# Patient Record
Sex: Female | Born: 1985
Health system: Southern US, Community
[De-identification: ages and names within clinical notes are randomized; demographics above are authoritative.]

## PROBLEM LIST (undated history)

## (undated) DIAGNOSIS — R519 Headache, unspecified: Secondary | ICD-10-CM

## (undated) DIAGNOSIS — R112 Nausea with vomiting, unspecified: Secondary | ICD-10-CM

## (undated) DIAGNOSIS — R51 Headache: Secondary | ICD-10-CM

## (undated) DIAGNOSIS — Z9889 Other specified postprocedural states: Secondary | ICD-10-CM

## (undated) HISTORY — PX: BREAST SURGERY: SHX581

## (undated) HISTORY — DX: Headache, unspecified: R51.9

## (undated) HISTORY — PX: LYMPH NODE DISSECTION: SHX5087

## (undated) HISTORY — PX: BREAST REDUCTION SURGERY: SHX8

## (undated) HISTORY — DX: Headache: R51

---

## 2008-03-28 ENCOUNTER — Emergency Department (HOSPITAL_COMMUNITY): Admission: EM | Admit: 2008-03-28 | Discharge: 2008-03-28 | Payer: Self-pay | Admitting: Emergency Medicine

## 2008-09-09 ENCOUNTER — Emergency Department (HOSPITAL_COMMUNITY): Admission: EM | Admit: 2008-09-09 | Discharge: 2008-09-09 | Payer: Self-pay | Admitting: Family Medicine

## 2008-09-25 ENCOUNTER — Encounter: Admission: RE | Admit: 2008-09-25 | Discharge: 2008-11-06 | Payer: Self-pay | Admitting: Family Medicine

## 2010-05-21 ENCOUNTER — Ambulatory Visit (HOSPITAL_BASED_OUTPATIENT_CLINIC_OR_DEPARTMENT_OTHER): Admission: RE | Admit: 2010-05-21 | Discharge: 2010-05-21 | Payer: Self-pay | Admitting: General Surgery

## 2011-01-22 LAB — POCT HEMOGLOBIN-HEMACUE: Hemoglobin: 11.4 g/dL — ABNORMAL LOW (ref 12.0–15.0)

## 2011-03-24 ENCOUNTER — Other Ambulatory Visit: Payer: Self-pay | Admitting: Plastic Surgery

## 2013-04-13 ENCOUNTER — Inpatient Hospital Stay (HOSPITAL_COMMUNITY)
Admission: AD | Admit: 2013-04-13 | Discharge: 2013-04-13 | Disposition: A | Discharge status: Home / Self Care | Payer: BC Managed Care – PPO | Source: Ambulatory Visit | Attending: Obstetrics & Gynecology | Admitting: Obstetrics & Gynecology

## 2013-04-13 ENCOUNTER — Encounter (HOSPITAL_COMMUNITY): Payer: Self-pay

## 2013-04-13 DIAGNOSIS — B3731 Acute candidiasis of vulva and vagina: Secondary | ICD-10-CM | POA: Insufficient documentation

## 2013-04-13 DIAGNOSIS — L293 Anogenital pruritus, unspecified: Secondary | ICD-10-CM | POA: Insufficient documentation

## 2013-04-13 DIAGNOSIS — B373 Candidiasis of vulva and vagina: Secondary | ICD-10-CM

## 2013-04-13 HISTORY — DX: Other specified postprocedural states: Z98.890

## 2013-04-13 HISTORY — DX: Nausea with vomiting, unspecified: R11.2

## 2013-04-13 LAB — WET PREP, GENITAL: Clue Cells Wet Prep HPF POC: NONE SEEN

## 2013-04-13 MED ORDER — FLUCONAZOLE 150 MG PO TABS
150.0000 mg | ORAL_TABLET | Freq: Once | ORAL | Status: AC
  Administered 2013-04-13: 150 mg via ORAL
  Filled 2013-04-13: qty 1

## 2013-04-13 MED ORDER — TERCONAZOLE 0.4 % VA CREA: 1.0000 | TOPICAL_CREAM | Freq: Every day | VAGINAL | Status: DC

## 2013-04-13 NOTE — MAU Provider Note (Signed)
  History     CSN: [id number]  Arrival date and time: 04/13/13 5188   First Provider Initiated Contact with Patient 04/13/13 1900      Chief Complaint  Patient presents with  . Vaginal Itching   HPI Joanne Summers is 27 y.o. G1P0010 patient of Deboraha Sprang presents with vaginal irritation after taking Amoxicillin X 5 from her dentist.  Describes as stinging.  Denies abnormal vaginal discharge, bleeding and abdominal pain.  Past Medical History  Diagnosis Date  . PONV (postoperative nausea and vomiting)     Past Surgical History  Procedure Laterality Date  . Lymph node dissection    . Breast reduction surgery      No family history on file.  History  Substance Use Topics  . Smoking status: Never Smoker   . Smokeless tobacco: Not on file  . Alcohol Use: No    Allergies: Allergies not on file  No prescriptions prior to admission    Review of Systems  Constitutional: Negative for fever and chills.  Gastrointestinal: Negative for abdominal pain.  Genitourinary: Negative for dysuria, urgency and frequency.       + for vaginal irritation   Physical Exam   Blood pressure 122/67, pulse 86, temperature 98.3 F (36.8 C), temperature source Oral, resp. rate 18, height 5\' 2"  (1.575 m), weight 140 lb 12.8 oz (63.866 kg), last menstrual period 03/30/2013.  Physical Exam  Constitutional: She is oriented to person, place, and time. She appears well-developed and well-nourished. No distress.  HENT:  Head: Normocephalic.  Neck: Normal range of motion.  Cardiovascular: Normal rate.   Respiratory: Effort normal.  GI: There is no tenderness.  Genitourinary: There is no rash, tenderness or lesion on the right labia. There is no rash, tenderness or lesion on the left labia. There is erythema and tenderness (mild) around the vagina. No bleeding around the vagina. No foreign body around the vagina. Vaginal discharge (small amount of white discharge ) found.  Internal exam not indicated   Neurological: She is alert and oriented to person, place, and time.  Skin: Skin is warm and dry.  Psychiatric: She has a normal mood and affect. Her behavior is normal.   Results for orders placed during the hospital encounter of 04/13/13 (from the past 24 hour(s))  WET PREP, GENITAL     Status: Abnormal   Collection Time    04/13/13  6:55 PM      Result Value Range   Yeast Wet Prep HPF POC FEW (*) NONE SEEN   Trich, Wet Prep NONE SEEN  NONE SEEN   Clue Cells Wet Prep HPF POC NONE SEEN  NONE SEEN   WBC, Wet Prep HPF POC MODERATE (*) NONE SEEN   MAU Course  Procedures  MDM Diflucan 150mg  po given in MAU  Assessment and Plan  A:  Vaginal irritation       Vaginal Yeast Infection  P: Rx for Terazol 7 Vaginal Creme to the pharmacy--to use at hs X 7hs      Follow up with her PCP if sxs continue after treatment.  KEY,EVE M 04/13/2013, 7:01 PM

## 2013-04-13 NOTE — MAU Note (Signed)
Pt reports having vaginal irritation after completing amoxicillin. No discharge. Feels dry and almost burning feeling.

## 2014-09-08 ENCOUNTER — Encounter (HOSPITAL_COMMUNITY): Payer: Self-pay

## 2018-04-05 ENCOUNTER — Ambulatory Visit: Payer: 59 | Admitting: Neurology

## 2018-04-05 ENCOUNTER — Encounter: Payer: Self-pay | Admitting: Neurology

## 2018-04-05 VITALS — BP 136/78 | HR 96 | Ht 62.0 in | Wt 168.0 lb

## 2018-04-05 DIAGNOSIS — H532 Diplopia: Secondary | ICD-10-CM | POA: Diagnosis not present

## 2018-04-05 DIAGNOSIS — R51 Headache with orthostatic component, not elsewhere classified: Secondary | ICD-10-CM

## 2018-04-05 DIAGNOSIS — H0520 Unspecified exophthalmos: Secondary | ICD-10-CM | POA: Diagnosis not present

## 2018-04-05 DIAGNOSIS — H5710 Ocular pain, unspecified eye: Secondary | ICD-10-CM

## 2018-04-05 DIAGNOSIS — H05119 Granuloma of unspecified orbit: Secondary | ICD-10-CM | POA: Diagnosis not present

## 2018-04-05 DIAGNOSIS — G8929 Other chronic pain: Secondary | ICD-10-CM

## 2018-04-05 DIAGNOSIS — H539 Unspecified visual disturbance: Secondary | ICD-10-CM

## 2018-04-05 DIAGNOSIS — G4484 Primary exertional headache: Secondary | ICD-10-CM

## 2018-04-05 DIAGNOSIS — G932 Benign intracranial hypertension: Secondary | ICD-10-CM

## 2018-04-05 DIAGNOSIS — H4710 Unspecified papilledema: Secondary | ICD-10-CM | POA: Insufficient documentation

## 2018-04-05 DIAGNOSIS — R519 Headache, unspecified: Secondary | ICD-10-CM

## 2018-04-05 NOTE — Progress Notes (Signed)
[facility]    Provider:  Dr Lucia Gaskins Referring Provider: Elwin Mocha* Primary Care Physician:  Ileana Ladd, MD  CC:  Optic nerve head edema  HPI:  Joanne Summers is a 32 y.o. female here as a referral from Dr. Sherryll Burger for bilateral disc edema.  Vision in our office today is 20/20 OD and OS.  She was seen by ophthalmology and sent here for evaluation for pseudotumor cerebri. Last summer she noticed a lymph node on her neck, she started getting headaches, random aches, she had pressure and tingling and aches in the head, she went to the eye doctor and veins were enlarged. Feels pressure in the head, she has double vision, blurry vision, dizziness and spots. Presure os on the right side of the head. Worse with exertion. No fullness in the ears. No IUD. No long term use of antibiotics, retin or acutane. No migrainous features. Headaches daily. Can last all day and be positional. No other focal neurologic deficits, associated symptoms, inciting events or modifiable factors.  Reviewed notes, labs and imaging from outside physicians, which showed:  Reviewed [facility].  32 year old female who presented for pseudotumor cerebra in the right eye and left eye.  She went in her yearly eye exam with Dr. Katrinka Blazing and told her that her eyes were swollen in both eyes.  She does have a headache aching at the top of the head.  No pulsatile tinnitus.  She is on birth control she is 5 to 160 pounds.  No recent head trauma.  Reviewed results of his exam which include pupils equally round and reactive to light no APD, open angles, visual fields full, extraocular movements full adnexa normal lids and lashes normal slit-lamp normal but optic nerves did show disc prominence disc edema bilaterally diagnosed with optic disc edema.  Visual field testing was outside normal limits but I do not have the actual data.  Review of Systems: Patient complains of symptoms per HPI as well as  the following symptoms: Headache, numbness, skin sensitivity. Pertinent negatives and positives per HPI. All others negative.   Social History   Socioeconomic History  . Marital status: Single    Spouse name: Not on file  . Number of children: Not on file  . Years of education: Not on file  . Highest education level: Not on file  Occupational History  . Not on file  Social Needs  . Financial resource strain: Not on file  . Food insecurity:    Worry: Not on file    Inability: Not on file  . Transportation needs:    Medical: Not on file    Non-medical: Not on file  Tobacco Use  . Smoking status: Never Smoker  . Smokeless tobacco: Never Used  Substance and Sexual Activity  . Alcohol use: No  . Drug use: No  . Sexual activity: Yes    Birth control/protection: Pill  Lifestyle  . Physical activity:    Days per week: Not on file    Minutes per session: Not on file  . Stress: Not on file  Relationships  . Social connections:    Talks on phone: Not on file    Gets together: Not on file    Attends religious service: Not on file    Active member of club or organization: Not on file    Attends meetings of clubs or organizations: Not on file    Relationship status: Not on file  . Intimate partner  violence:    Fear of current or ex partner: Not on file    Emotionally abused: Not on file    Physically abused: Not on file    Forced sexual activity: Not on file  Other Topics Concern  . Not on file  Social History Narrative  . Not on file    Family History  Problem Relation Age of Onset  . Migraines Neg Hx     Past Medical History:  Diagnosis Date  . Headache   . PONV (postoperative nausea and vomiting)     Past Surgical History:  Procedure Laterality Date  . BREAST REDUCTION SURGERY    . LYMPH NODE DISSECTION      Current Outpatient Medications  Medication Sig Dispense Refill  . Levonorgestrel-Ethinyl Estradiol (SEASONIQUE) 0.15-0.03 &0.01 MG tablet Take 1  tablet by mouth daily.    . Multiple Vitamin (MULTIVITAMIN) capsule Take by mouth.     No current facility-administered medications for this visit.     Allergies as of 04/05/2018  . (No Known Allergies)    Vitals: BP 136/78   Pulse 96   Ht  (1.575 m)   Wt 168 lb (76.2 kg)   BMI 30.73 kg/m  Last Weight:  Wt Readings from Last 1 Encounters:  04/05/18 168 lb (76.2 kg)   Last Height:   Ht Readings from Last 1 Encounters:  04/05/18  (1.575 m)   Physical exam: Exam: Gen: NAD, conversant, well nourised, obese, well groomed                     CV: RRR, no MRG. No Carotid Bruits. No peripheral edema, warm, nontender Eyes: Conjunctivae clear without exudates or hemorrhage  Neuro: Detailed Neurologic Exam  Speech:    Speech is normal; fluent and spontaneous with normal comprehension.  Cognition:    The patient is oriented to person, place, and time;     recent and remote memory intact;     language fluent;     normal attention, concentration,     fund of knowledge Cranial Nerves:    The pupils are equal, round, and reactive to light. Proptosis of the globes. The fundi are blurred Visual fields are full to finger confrontation. Extraocular movements are intact. Trigeminal sensation is intact and the muscles of mastication are normal. The face is symmetric. The palate elevates in the midline. Hearing intact. Voice is normal. Shoulder shrug is normal. The tongue has normal motion without fasciculations.   Coordination:    Normal finger to nose and heel to shin. Normal rapid alternating movements.   Gait:    Heel-toe and tandem gait are normal.   Motor Observation:    No asymmetry, no atrophy, and no involuntary movements noted. Tone:    Normal muscle tone.    Posture:    Posture is normal. normal erect    Strength:    Strength is V/V in the upper and lower limbs.      Sensation: intact to LT     Reflex Exam:  DTR's:    Deep tendon reflexes in the upper  and lower extremities are normal bilaterally.   Toes:    The toes are downgoing bilaterally.   Clonus:    Clonus is absent.       Assessment/Plan:  32 year old patient with positional daily intractable headaches, bilateral optic disc edema, episodes of vision changes and diplopia, dizziness and vertigo. Need MRI of the brain to evaluate for space  occupying mass, severe chiari, intracranial hypertension or any other etiology. Exam also shows eye globe proptosis, will check thyroid panel and antibodies but also need MRI of the orbits to evaluate for compressive thyroid disease, orbital pseudotumor or other abnormalities causing diplopia, eye proptosis and other concerning symptoms above. Will also order LP. If elevated opening pressure, will start diamox. If vision or an other symptoms changes acutely or anything concerning go to ED immediately. -If above workup shows idiopathic intracranial HTN, will need MRV of the brain to distinguish benign intracranial hypertension (pseudotumor cerebri) from dural sinus thrombosis.    Orders Placed This Encounter  Procedures  . MR BRAIN W WO CONTRAST  . MR ORBITS W WO CONTRAST  . DG FLUORO GUIDED LOC OF NEEDLE/CATH TIP FOR SPINAL INJECT LT  . Thyroid Panel With TSH  . Thyroglobulin antibody  . Thyroid peroxidase antibody  . Comprehensive metabolic panel  . CBC   Cc: Dr. Sherryll Burger, Dr. Timmie Foerster, MD  [facility] [address] [place], [place] [address]  Phone [phone] Fax [phone]

## 2018-04-05 NOTE — Patient Instructions (Addendum)
MRI of the brain and orbits (will call) Labs today Lumbar Puncture (will call) 6 week follow up     Idiopathic Intracranial Hypertension Idiopathic intracranial hypertension (IIH) is a condition that increases pressure around the brain. The fluid that surrounds the brain and spinal cord (cerebrospinal fluid, CSF) increases and causes the pressure. Idiopathic means that the cause of this condition is not known. IIH affects the brain and spinal cord (is a neurological disorder). If this condition is not treated, it can cause vision loss or blindness. What increases the risk? You are more likely to develop this condition if:  You are severely overweight (obese).  You are a woman who has not gone through menopause.  You take certain medicines, such as birth control or steroids.  What are the signs or symptoms? Symptoms of IIH include:  Headaches. This is the most common symptom.  Pain in the shoulders or neck.  Nausea and vomiting.  A "rushing water" or pulsing sound within the ears (pulsatile tinnitus).  Double vision.  Blurred vision.  Brief episodes of complete vision loss.  How is this diagnosed? This condition may be diagnosed based on:  Your symptoms.  Your medical history.  CT scan of the brain.  MRI of the brain.  Magnetic resonance venogram (MRV) to check veins in the brain.  Diagnostic lumbar puncture. This is a procedure to remove and examine a sample of cerebrospinal fluid. This procedure can determine whether too much fluid may be causing IIH.  A thorough eye exam to check for swelling or nerve damage in the eyes.  How is this treated? Treatment for this condition depends on your symptoms. The goal of treatment is to decrease the pressure around your brain. Common treatments include:  Medicines to decrease the production of spinal fluid and lower the pressure within your skull.  Medicines to prevent or treat headaches.  Surgery to place drains  (shunts) in your brain to remove excess fluid.  Lumbar puncture to remove excess cerebrospinal fluid.  Follow these instructions at home:  If you are overweight or obese, work with your health care provider to lose weight.  Take over-the-counter and prescription medicines only as told by your health care provider.  Do not drive or use heavy machinery while taking medicines that can make you sleepy.  Keep all follow-up visits as told by your health care provider. This is important. Contact a health care provider if:  You have changes in your vision, such as: ? Double vision. ? Not being able to see colors (color vision). Get help right away if:  You have any of the following symptoms and they get worse or do not get better. ? Headaches. ? Nausea. ? Vomiting. ? Vision changes or difficulty seeing. Summary  Idiopathic intracranial hypertension (IIH) is a condition that increases pressure around the brain. The cause is not known (is idiopathic).  The most common symptom of IIH is headaches.  Treatment may include medicines or surgery to relieve the pressure on your brain. This information is not intended to replace advice given to you by your health care provider. Make sure you discuss any questions you have with your health care provider. Document Released: 01/02/2002 Document Revised: 09/14/2016 Document Reviewed: 09/14/2016 Elsevier Interactive Patient Education  2017 Elsevier Inc.     Acetazolamide sustained-release capsules What is this medicine? ACETAZOLAMIDE (a set a ZOLE a mide) is used to treat glaucoma. It is also used to treat and to prevent altitude or mountain sickness. This  medicine may be used for other purposes; ask your health care provider or pharmacist if you have questions. COMMON BRAND NAME(S): Diamox, Diamox Sequels What should I tell my health care provider before I take this medicine? They need to know if you have any of these  conditions: -diabetes -kidney disease -liver disease -lung disease -an unusual or allergic reaction to acetazolamide, sulfa drugs, other medicines, foods, dyes, or preservatives -pregnant or trying to get pregnant -breast-feeding How should I use this medicine? Take this medicine by mouth with a glass of water. Follow the directions on the prescription label. Take with food or on an empty stomach. Do not crush or chew. Take your doses at regular intervals. Do not take your medicine more often than directed. Do not stop taking except on your doctor's advice. Talk to your pediatrician regarding the use of this medicine in children. Special care may be needed. Patients over 8 years old may have a stronger reaction and need a smaller dose. Overdosage: If you think you have taken too much of this medicine contact a poison control center or emergency room at once. NOTE: This medicine is only for you. Do not share this medicine with others. What if I miss a dose? If you miss a dose, take it as soon as you can. If it is almost time for your next dose, take only that dose. Do not take double or extra doses. What may interact with this medicine? Do not take this medicine with any of the following medications: -methazolamide This medicine may also interact with the following medications: -aspirin and aspirin-like medicines -cyclosporine -lithium -medicine for diabetes -methenamine -other diuretics -phenytoin -primidone -quinidine -sodium bicarbonate -stimulant medicines like dextroamphetamine This list may not describe all possible interactions. Give your health care provider a list of all the medicines, herbs, non-prescription drugs, or dietary supplements you use. Also tell them if you smoke, drink alcohol, or use illegal drugs. Some items may interact with your medicine. What should I watch for while using this medicine? Visit your doctor or health care professional for regular checks on  your progress. You will need blood work done regularly. If you are diabetic, check your blood sugar as directed. You may need to be on a special diet while taking this medicine. Ask your doctor. Also, ask how many glasses of fluid you need to drink a day. You must not get dehydrated. You may get drowsy or dizzy. Do not drive, use machinery, or do anything that needs mental alertness until you know how this medicine affects you. Do not stand or sit up quickly, especially if you are an older patient. This reduces the risk of dizzy or fainting spells. This medicine can make you more sensitive to the sun. Keep out of the sun. If you cannot avoid being in the sun, wear protective clothing and use sunscreen. Do not use sun lamps or tanning beds/booths. What side effects may I notice from receiving this medicine? Side effects that you should report to your doctor or health care professional as soon as possible: -allergic reactions like skin rash, itching or hives, swelling of the face, lips, or tongue -breathing problems -confusion, depression -dark urine -fever -numbness, tingling in hands or feet -redness, blistering, peeling or loosening of the skin, including inside the mouth -ringing in the ears -seizure -unusually weak or tired -yellowing of the eyes or skin Side effects that usually do not require medical attention (report to your doctor or health care professional if they continue  or are bothersome): -change in taste -diarrhea -headache -loss of appetite -nausea, vomiting -passing urine more often This list may not describe all possible side effects. Call your doctor for medical advice about side effects. You may report side effects to FDA at 1-800-FDA-1088. Where should I keep my medicine? Keep out of the reach of children. Store at room temperature between 20 and 25 degrees C (68 and 77 degrees F). Throw away any unused medicine after the expiration date. NOTE: This sheet is a summary.  It may not cover all possible information. If you have questions about this medicine, talk to your doctor, pharmacist, or health care provider.  2018 Elsevier/Gold Standard (2008-01-16 11:00:54)

## 2018-04-06 LAB — CBC
HEMATOCRIT: 38.5 % (ref 34.0–46.6)
Hemoglobin: 12.6 g/dL (ref 11.1–15.9)
MCH: 31.3 pg (ref 26.6–33.0)
MCHC: 32.7 g/dL (ref 31.5–35.7)
MCV: 96 fL (ref 79–97)
Platelets: 413 10*3/uL (ref 150–450)
RBC: 4.03 x10E6/uL (ref 3.77–5.28)
RDW: 13 % (ref 12.3–15.4)
WBC: 7.4 10*3/uL (ref 3.4–10.8)

## 2018-04-06 LAB — COMPREHENSIVE METABOLIC PANEL
ALBUMIN: 4 g/dL (ref 3.5–5.5)
ALK PHOS: 31 IU/L — AB (ref 39–117)
ALT: 23 IU/L (ref 0–32)
AST: 24 IU/L (ref 0–40)
Albumin/Globulin Ratio: 1.3 (ref 1.2–2.2)
BUN/Creatinine Ratio: 11 (ref 9–23)
BUN: 10 mg/dL (ref 6–20)
Bilirubin Total: 0.3 mg/dL (ref 0.0–1.2)
CALCIUM: 9 mg/dL (ref 8.7–10.2)
CO2: 19 mmol/L — AB (ref 20–29)
CREATININE: 0.87 mg/dL (ref 0.57–1.00)
Chloride: 107 mmol/L — ABNORMAL HIGH (ref 96–106)
GFR calc Af Amer: 101 mL/min/{1.73_m2} (ref >59)
GFR, EST NON AFRICAN AMERICAN: 88 mL/min/{1.73_m2} (ref >59)
Globulin, Total: 3.1 g/dL (ref 1.5–4.5)
Glucose: 95 mg/dL (ref 65–99)
Potassium: 4.3 mmol/L (ref 3.5–5.2)
Sodium: 140 mmol/L (ref 134–144)
Total Protein: 7.1 g/dL (ref 6.0–8.5)

## 2018-04-06 LAB — THYROID PANEL WITH TSH
FREE THYROXINE INDEX: 1.4 (ref 1.2–4.9)
T3 Uptake Ratio: 19 % — ABNORMAL LOW (ref 24–39)
T4, Total: 7.4 ug/dL (ref 4.5–12.0)
TSH: 1.24 u[IU]/mL (ref 0.450–4.500)

## 2018-04-06 LAB — THYROGLOBULIN ANTIBODY

## 2018-04-06 LAB — THYROID PEROXIDASE ANTIBODY: Thyroperoxidase Ab SerPl-aCnc: 13 IU/mL (ref 0–34)

## 2018-04-09 ENCOUNTER — Telehealth: Payer: Self-pay | Admitting: Neurology

## 2018-04-09 ENCOUNTER — Telehealth: Payer: Self-pay | Admitting: *Deleted

## 2018-04-09 NOTE — Telephone Encounter (Signed)
[employer] Auth: [id number] (exp. 04/09/18 to 05/24/18) & [id number] (exp. 04/09/18 to 05/24/18) pt is scheduled at [facility] for 04/14/18.

## 2018-04-09 NOTE — Telephone Encounter (Signed)
Called pt and LVM (ok per DPR) informing her that her labs are unremarkable and her thyroid is normal. Left office number in case pt has any questions and informed her that a call back is not required.

## 2018-04-09 NOTE — Telephone Encounter (Signed)
Pt returned call. She wanted to clarify plan for starting Diamox and thought it was supposed be after testing. RN clarified that in her office visit, Dr. Lucia GaskinsAhern said that we would get the LP and if the opening pressure was high, we would start Diamox. Pt verbalized understanding and appreciation. She has an MRI scheduled for 6/8. Her LP is still pending scheduling.

## 2018-04-09 NOTE — Telephone Encounter (Signed)
-----   Message from Anson FretAntonia B Ahern, MD sent at 04/08/2018  7:23 PM EDT ----- Labs are unremarkable, thyroid id normal

## 2018-04-14 ENCOUNTER — Ambulatory Visit
Admission: RE | Admit: 2018-04-14 | Discharge: 2018-04-14 | Disposition: A | Discharge status: Home / Self Care | Payer: 59 | Source: Ambulatory Visit | Attending: Neurology | Admitting: Neurology

## 2018-04-14 ENCOUNTER — Ambulatory Visit
Admission: RE | Admit: 2018-04-14 | Discharge: 2018-04-14 | Disposition: A | Discharge status: Home / Self Care | Payer: BC Managed Care – PPO | Source: Ambulatory Visit | Attending: Neurology | Admitting: Neurology

## 2018-04-14 DIAGNOSIS — H0520 Unspecified exophthalmos: Secondary | ICD-10-CM

## 2018-04-14 DIAGNOSIS — H532 Diplopia: Secondary | ICD-10-CM | POA: Diagnosis not present

## 2018-04-14 DIAGNOSIS — H4710 Unspecified papilledema: Secondary | ICD-10-CM

## 2018-04-14 DIAGNOSIS — G4484 Primary exertional headache: Secondary | ICD-10-CM

## 2018-04-14 DIAGNOSIS — R519 Headache, unspecified: Secondary | ICD-10-CM

## 2018-04-14 DIAGNOSIS — H539 Unspecified visual disturbance: Secondary | ICD-10-CM

## 2018-04-14 DIAGNOSIS — R51 Headache with orthostatic component, not elsewhere classified: Secondary | ICD-10-CM

## 2018-04-14 DIAGNOSIS — G8929 Other chronic pain: Secondary | ICD-10-CM

## 2018-04-14 DIAGNOSIS — H5710 Ocular pain, unspecified eye: Secondary | ICD-10-CM

## 2018-04-14 DIAGNOSIS — H05119 Granuloma of unspecified orbit: Secondary | ICD-10-CM

## 2018-04-14 MED ORDER — GADOBENATE DIMEGLUMINE 529 MG/ML IV SOLN
15.0000 mL | Freq: Once | INTRAVENOUS | Status: AC | PRN
  Administered 2018-04-14: 15 mL via INTRAVENOUS

## 2018-04-17 ENCOUNTER — Telehealth: Payer: Self-pay | Admitting: *Deleted

## 2018-04-17 NOTE — Telephone Encounter (Addendum)
Called pt & LVM asking for call back. Left office number in message. Please pass message along to pt when she calls back.   ----- Message from Anson FretAntonia B Ahern, MD sent at 04/17/2018 11:05 AM EDT ----- MRI orbits normal  Notes recorded by Anson FretAhern, Antonia B, MD on 04/17/2018 at 11:02 AM EDT Unremarkable for age MRI thanks

## 2018-04-17 NOTE — Telephone Encounter (Signed)
FYI Pt called, message was relayed to her.  No questions. No request for a call back

## 2018-04-20 ENCOUNTER — Ambulatory Visit
Admission: RE | Admit: 2018-04-20 | Discharge: 2018-04-20 | Disposition: A | Discharge status: Home / Self Care | Payer: 59 | Source: Ambulatory Visit | Attending: Neurology | Admitting: Neurology

## 2018-04-20 DIAGNOSIS — H539 Unspecified visual disturbance: Secondary | ICD-10-CM

## 2018-04-20 DIAGNOSIS — H05119 Granuloma of unspecified orbit: Secondary | ICD-10-CM

## 2018-04-20 DIAGNOSIS — R51 Headache with orthostatic component, not elsewhere classified: Secondary | ICD-10-CM

## 2018-04-20 DIAGNOSIS — H532 Diplopia: Secondary | ICD-10-CM

## 2018-04-20 DIAGNOSIS — G932 Benign intracranial hypertension: Secondary | ICD-10-CM

## 2018-04-20 DIAGNOSIS — H5710 Ocular pain, unspecified eye: Secondary | ICD-10-CM

## 2018-04-20 DIAGNOSIS — G4484 Primary exertional headache: Secondary | ICD-10-CM

## 2018-04-20 DIAGNOSIS — G8929 Other chronic pain: Secondary | ICD-10-CM

## 2018-04-20 DIAGNOSIS — H4710 Unspecified papilledema: Secondary | ICD-10-CM

## 2018-04-20 DIAGNOSIS — H0520 Unspecified exophthalmos: Secondary | ICD-10-CM

## 2018-04-20 DIAGNOSIS — R519 Headache, unspecified: Secondary | ICD-10-CM

## 2018-04-20 LAB — CSF CELL COUNT WITH DIFFERENTIAL
RBC Count, CSF: 0 cells/uL (ref 0–10)
WBC, CSF: 4 cells/uL (ref 0–5)

## 2018-04-20 LAB — PROTEIN, CSF: Total Protein, CSF: 27 mg/dL (ref 15–45)

## 2018-04-20 LAB — GLUCOSE, CSF: Glucose, CSF: 49 mg/dL (ref 40–80)

## 2018-04-20 NOTE — Discharge Instructions (Signed)

## 2018-05-01 ENCOUNTER — Other Ambulatory Visit: Payer: Self-pay | Admitting: Neurology

## 2018-05-01 ENCOUNTER — Telehealth: Payer: Self-pay | Admitting: Neurology

## 2018-05-01 DIAGNOSIS — G932 Benign intracranial hypertension: Secondary | ICD-10-CM

## 2018-05-01 MED ORDER — ACETAZOLAMIDE 250 MG PO TABS: ORAL_TABLET | ORAL | 6 refills | Status: DC

## 2018-05-01 NOTE — Telephone Encounter (Signed)
Pt is asking for a call once the LP results are available

## 2018-05-01 NOTE — Telephone Encounter (Signed)
Spoke to patient, I'm sorry I didn't receive this result. Opening pressure was significantly elevated at 30. Will start Diamox 250mg  twice daily. Will hold off on MRV will discuss at appointment.

## 2018-05-02 NOTE — Telephone Encounter (Signed)
Noted  

## 2018-05-07 ENCOUNTER — Telehealth: Payer: Self-pay | Admitting: Neurology

## 2018-05-07 NOTE — Telephone Encounter (Signed)
Called the patient and made her aware that I reviewed her concerns with  Dr Lucia GaskinsAhern. I informed her that Dr Lucia GaskinsAhern states it can sometimes take about 3 wks before kicking in but typically its 4-6 wks before noticing improvement. I also informed her that Dr Lucia GaskinsAhern would like for her to stay well hydrated and make sure her urine is light yellow. If urine dark then encouraged her to increase fluids. Pt verbalized understanding and was appreciative for the call back. Pt had no questions at this time but was encouraged to call back if questions arise.

## 2018-05-07 NOTE — Telephone Encounter (Signed)
It may take 4-6 weeks at least to cee improvement or possibly up to 3 weeks. Continue medication, stay well hydrated make sure urine is a light yellow or increase fluids

## 2018-05-07 NOTE — Telephone Encounter (Signed)
Pt requesting a call stating she has a few questions.  1. Pt needing to know how long it takes for medication acetaZOLAMIDE (DIAMOX) 250 MG tablet to "kick in". Stating she has taken medication for about 12 days, and symptoms have gotten worse. 2. Also should pt be increasing her water intake while on medication. Pts pain has also moved from the front of her head to the back, pt is unclear why. Please call to advise

## 2018-06-12 ENCOUNTER — Telehealth: Payer: Self-pay | Admitting: Neurology

## 2018-06-12 ENCOUNTER — Encounter: Payer: Self-pay | Admitting: Neurology

## 2018-06-12 ENCOUNTER — Ambulatory Visit: Payer: 59 | Admitting: Neurology

## 2018-06-12 VITALS — BP 121/81 | HR 90 | Ht 62.0 in | Wt 160.0 lb

## 2018-06-12 DIAGNOSIS — G08 Intracranial and intraspinal phlebitis and thrombophlebitis: Secondary | ICD-10-CM | POA: Diagnosis not present

## 2018-06-12 DIAGNOSIS — G932 Benign intracranial hypertension: Secondary | ICD-10-CM

## 2018-06-12 NOTE — Progress Notes (Addendum)
[facility]    Provider:  Dr Lucia Gaskins Referring Provider: Ileana Ladd, MD Primary Care Physician:  Ileana Ladd, MD  CC:  Optic nerve head edema  Interval history December 10, 1983: Pressue has resolved, she doesn't experience the vision changes, tingling in the head is better, no double vision, no blurry vision, left ear fullness improved.  No headaches.  Will order CTV to complete workup.  Orders Placed This Encounter  Procedures  . CT ANGIO HEAD W OR WO CONTRAST    HPI:  Joanne Summers is a 32 y.o. female here as a referral from Dr. Modesto Charon for bilateral disc edema.  Vision in our office today is 20/20 OD and OS.  She was seen by ophthalmology and sent here for evaluation for pseudotumor cerebri. Last summer she noticed a lymph node on her neck, she started getting headaches, random aches, she had pressure and tingling and aches in the head, she went to the eye doctor and veins were enlarged. Feels pressure in the head, she has double vision, blurry vision, dizziness and spots. Presure os on the right side of the head. Worse with exertion. No fullness in the ears. No IUD. No long term use of antibiotics, retin or acutane. No migrainous features. Headaches daily. Can last all day and be positional. No other focal neurologic deficits, associated symptoms, inciting events or modifiable factors.  Reviewed notes, labs and imaging from outside physicians, which showed:  Reviewed [facility].  32 year old female who presented for pseudotumor cerebra in the right eye and left eye.  She went in her yearly eye exam with Dr. Katrinka Blazing and told her that her eyes were swollen in both eyes.  She does have a headache aching at the top of the head.  No pulsatile tinnitus.  She is on birth control she is 160 pounds.  No recent head trauma.  Reviewed results of his exam which include pupils equally round and reactive to light no APD, open angles, visual fields full, extraocular  movements full adnexa normal lids and lashes normal slit-lamp normal but optic nerves did show disc prominence disc edema bilaterally diagnosed with optic disc edema.  Visual field testing was outside normal limits but I do not have the actual data.   Review of Systems: Patient complains of symptoms per HPI as well as the following symptoms: Headache, numbness, skin sensitivity. Pertinent negatives and positives per HPI. All others negative.   Social History   Socioeconomic History  . Marital status: Single    Spouse name: Not on file  . Number of children: Not on file  . Years of education: Not on file  . Highest education level: Master's degree (e.g., MA, MS, MEng, MEd, MSW, MBA)  Occupational History  . Not on file  Social Needs  . Financial resource strain: Not on file  . Food insecurity:    Worry: Not on file    Inability: Not on file  . Transportation needs:    Medical: Not on file    Non-medical: Not on file  Tobacco Use  . Smoking status: Never Smoker  . Smokeless tobacco: Never Used  Substance and Sexual Activity  . Alcohol use: No  . Drug use: No  . Sexual activity: Yes    Birth control/protection: Pill  Lifestyle  . Physical activity:    Days per week: Not on file    Minutes per session: Not on file  . Stress: Not on file  Relationships  . Social  connections:    Talks on phone: Not on file    Gets together: Not on file    Attends religious service: Not on file    Active member of club or organization: Not on file    Attends meetings of clubs or organizations: Not on file    Relationship status: Not on file  . Intimate partner violence:    Fear of current or ex partner: Not on file    Emotionally abused: Not on file    Physically abused: Not on file    Forced sexual activity: Not on file  Other Topics Concern  . Not on file  Social History Narrative   Lives at home alone   Right handed   Caffeine: sometimes maybe once or twice a week    Family  History  Problem Relation Age of Onset  . Migraines Neg Hx     Past Medical History:  Diagnosis Date  . Headache   . PONV (postoperative nausea and vomiting)     Past Surgical History:  Procedure Laterality Date  . BREAST REDUCTION SURGERY    . LYMPH NODE DISSECTION      Current Outpatient Medications  Medication Sig Dispense Refill  . acetaZOLAMIDE (DIAMOX) 250 MG tablet Start with 250mg (1 tab) twice daily. If no side effects increase to 500mg (2 tabs) twice daily 120 tablet 6  . Levonorgestrel-Ethinyl Estradiol (SEASONIQUE) 0.15-0.03 &0.01 MG tablet Take 1 tablet by mouth daily.    . Multiple Vitamin (MULTIVITAMIN) capsule Take by mouth.     No current facility-administered medications for this visit.     Allergies as of 06/12/2018  . (No Known Allergies)    Vitals: BP 121/81 (BP Location: Right Arm, Patient Position: Sitting)   Pulse 90   Ht 5\' 2"  (1.575 m)   Wt 160 lb (72.6 kg)   BMI 29.26 kg/m  Last Weight:  Wt Readings from Last 1 Encounters:  06/12/18 160 lb (72.6 kg)   Last Height:   Ht Readings from Last 1 Encounters:  06/12/18 5\' 2"  (1.575 m)   Physical exam: Exam: Gen: NAD, conversant, well nourised, obese, well groomed                     CV: RRR, no MRG. No Carotid Bruits. No peripheral edema, warm, nontender Eyes: Conjunctivae clear without exudates or hemorrhage  Neuro: Detailed Neurologic Exam  Speech:    Speech is normal; fluent and spontaneous with normal comprehension.  Cognition:    The patient is oriented to person, place, and time;     recent and remote memory intact;     language fluent;     normal attention, concentration,     fund of knowledge Cranial Nerves:    The pupils are equal, round, and reactive to light. Proptosis of the globes. The fundi appear to be improved/ normal. Visual fields are full to finger confrontation. Extraocular movements are intact. Trigeminal sensation is intact and the muscles of mastication are normal.  The face is symmetric. The palate elevates in the midline. Hearing intact. Voice is normal. Shoulder shrug is normal. The tongue has normal motion without fasciculations.   Coordination:    Normal finger to nose and heel to shin. Normal rapid alternating movements.   Gait:    Heel-toe and tandem gait are normal.   Motor Observation:    No asymmetry, no atrophy, and no involuntary movements noted. Tone:    Normal muscle tone.    Posture:  Posture is normal. normal erect    Strength:    Strength is V/V in the upper and lower limbs.      Sensation: intact to LT     Reflex Exam:  DTR's:    Deep tendon reflexes in the upper and lower extremities are normal bilaterally.   Toes:    The toes are downgoing bilaterally.   Clonus:    Clonus is absent.       Assessment/Plan:  32 year old patient with intracranial HTN, bilateral optic disk edema  - Opening pressure 30  - TSH and antibodies normal -  CTV to complete workup for cerebrovenous thrombosis - shows focal stenosis that could be reason for intracranial HTN, can consider stenting and monitor yearly. - Papilledema improved withh continue 500mg  twice daily abd follow up with Dr. Sherryll BurgerShah if he agrees papilledema much improved can decrease to 250mg  bid   Discussed: To prevent or relieve headaches, try the following: Cool Compress. Lie down and place a cool compress on your head.  Avoid headache triggers. If certain foods or odors seem to have triggered your migraines in the past, avoid them. A headache diary might help you identify triggers.  Include physical activity in your daily routine. Try a daily walk or other moderate aerobic exercise.  Manage stress. Find healthy ways to cope with the stressors, such as delegating tasks on your to-do list.  Practice relaxation techniques. Try deep breathing, yoga, massage and visualization.  Eat regularly. Eating regularly scheduled meals and maintaining a healthy diet might help prevent  headaches. Also, drink plenty of fluids.  Follow a regular sleep schedule. Sleep deprivation might contribute to headaches Consider biofeedback. With this mind-body technique, you learn to control certain bodily functions - such as muscle tension, heart rate and blood pressure - to prevent headaches or reduce headache pain.    Proceed to emergency room if you experience new or worsening symptoms or symptoms do not resolve, if you have new neurologic symptoms or if headache is severe, or for any concerning symptom.   Provided education and documentation from American headache Society toolbox including articles on: chronic migraine medication overuse headache, chronic migraines, prevention of migraines, behavioral and other nonpharmacologic treatments for headache.   Cc: Dr. Sherryll BurgerShah, Dr. Timmie FoersterWong   Zimir Kittleson, MD  [facility] [address] [place] [id number]  Phone [phone] Fax [phone]  A total of 25 minutes was spent face-to-face with this patient. Over half this time was spent on counseling patient on the  1. Intracranial hypertension   2. Intracranial and intraspinal phlebitis and thrombophlebitis   3. IIH (idiopathic intracranial hypertension)    diagnosis and different diagnostic and therapeutic options, counseling and coordination of care, risks ans benefits of management, compliance, or risk factor reduction and education.

## 2018-06-12 NOTE — Telephone Encounter (Signed)
[employer] pending faxed clinical notes  °

## 2018-06-13 NOTE — Telephone Encounter (Signed)
[employer] Auth: [id number]: [id number] (exp. 06/12/18 to 07/27/18) order sent to GI. They will reach out to the pt to schedule.

## 2018-06-26 ENCOUNTER — Ambulatory Visit
Admission: RE | Admit: 2018-06-26 | Discharge: 2018-06-26 | Disposition: A | Discharge status: Home / Self Care | Payer: 59 | Source: Ambulatory Visit | Attending: Neurology | Admitting: Neurology

## 2018-06-26 DIAGNOSIS — G932 Benign intracranial hypertension: Secondary | ICD-10-CM

## 2018-06-26 DIAGNOSIS — G08 Intracranial and intraspinal phlebitis and thrombophlebitis: Secondary | ICD-10-CM

## 2018-06-26 MED ORDER — IOPAMIDOL (ISOVUE-370) INJECTION 76%
75.0000 mL | Freq: Once | INTRAVENOUS | Status: AC | PRN
  Administered 2018-06-26: 75 mL via INTRAVENOUS

## 2018-06-27 ENCOUNTER — Telehealth: Payer: Self-pay | Admitting: *Deleted

## 2018-06-27 NOTE — Telephone Encounter (Signed)
Patient is returning a call. °

## 2018-06-27 NOTE — Telephone Encounter (Signed)
Spoke with patient and informed her that the CT scan of the venous sinuses (where fluid drains out of the brain) shows that there is narrowing in one area which could be the reason she is having increased pressure in the brain (because it stops the fluid from flowing out). There are procedures to put a stent into the area of narrowing or we can continue with current medication treatment. Dr. Lucia GaskinsAhern will likely keep an eye on the narrowing yearly as well. Patient's questions were answered. She said sometimes when she sniffs hard she feels a spot tingling in her head. Also she has a sensitive spot on her scalp, she denied any external damage to the skin that she knows of. Patient advised that would not be related to the inside brain area. Pt verbalized understanding. She stated that for now she wants to manage with current medications and monitor yearly. She stated the pressure and ache in her head is better and [facility] showed improved pressure in eyes as well. Pt appreciative and had no further questions. She is going to talk this over with her mother and will call us back if she changes her mind.   Dr. Lucia GaskinsAhern aware of the above.

## 2018-06-27 NOTE — Telephone Encounter (Signed)
Pt returned RN's call. She is requesting to be called back on work # [phone]

## 2018-06-27 NOTE — Telephone Encounter (Signed)
-----   Message from Anson FretAntonia B Ahern, MD sent at 06/26/2018 12:14 PM EDT ----- The CT scan of the venous sinuses (where fluid drains out of the brain) shows that there is narrowing in one area which could be the reason she is having increased pressure in the brain (because it stope the fluid from flowing out). There are procedures to put stent Into the are of narrowing or we can continue with current medication treatment. We should probably keep an eye on the narrowing yearly as well. thanks

## 2018-06-27 NOTE — Telephone Encounter (Addendum)
Tried to call pt. LVM. Asked for call back.

## 2018-06-27 NOTE — Telephone Encounter (Signed)
Called pt and LVM (ok per DPR) asking for call back to discuss CT results. Left office note in chart.

## 2018-08-15 ENCOUNTER — Telehealth: Payer: Self-pay | Admitting: Neurology

## 2018-08-15 NOTE — Telephone Encounter (Signed)
Per Dr. Lucia Gaskins, will make sure pt is taking Diamox 500 mg BID. Will hold. Doesn't sound like IIH.

## 2018-08-15 NOTE — Telephone Encounter (Signed)
Pt called stating that for about a week or so she has been experiencing "aches and pains in the crown of my head" stating she is unsure if this is a s/e from her cold and over the counter meds or if she should discuss increasing medications please advise

## 2018-08-16 NOTE — Telephone Encounter (Signed)
Called patient and discussed that Dr. Lucia Gaskins doesn't feel the "aches and pains in the crown of her head" is related to her IIH. Dr. Lucia Gaskins wanted to make sure the patient was taking Diamox 500 mg BID and we will hold there. Patient verbalized understanding and confirmed that she is taking Diamox 500 mg twice daily. She verbalized understanding and appreciation. She was told that she can always call back if needed in the future if needed.

## 2018-12-10 ENCOUNTER — Other Ambulatory Visit: Payer: Self-pay | Admitting: Neurology

## 2018-12-10 DIAGNOSIS — G932 Benign intracranial hypertension: Secondary | ICD-10-CM

## 2018-12-20 IMAGING — CT CT ANGIO HEAD
3 of 12 series · 19 of 47 positions shown · IV contrast (iopamidol)
Comparison: Brain MRI 04/14/2018

CLINICAL DATA: Idiopathic intracranial hypertension. Rule out dural
venous sinus thrombosis.

EXAM:
CT VENOGRAM HEAD
TECHNIQUE: After bolus administration of IV contrast head CT was obtained in
the venous outflow phase.
CONTRAST:  75mL [id number] IOPAMIDOL ([id number]) INJECTION 76%

[Series 5: head 0.60 hr40 s3 thins ibhc · axial · 0.41mm/px · z∈[-513,-371]mm · 16 of 266 slices shown]
[im 14/266  brain]
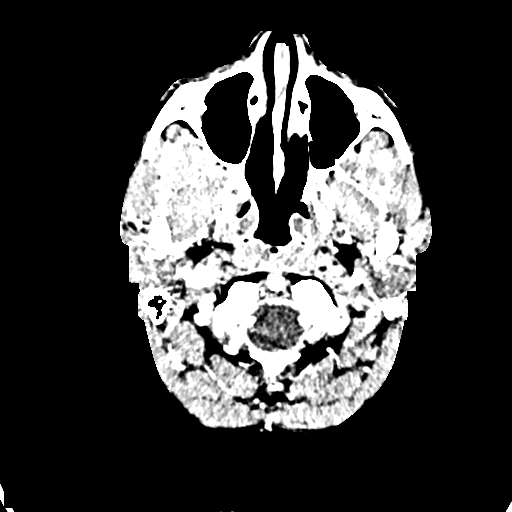
[im 27/266  bone]
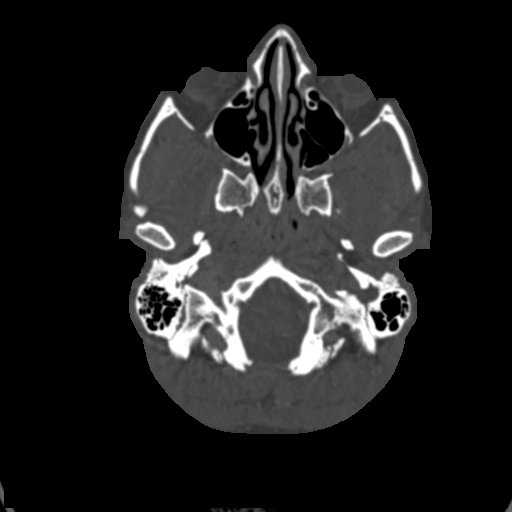
[im 40/266  brain]
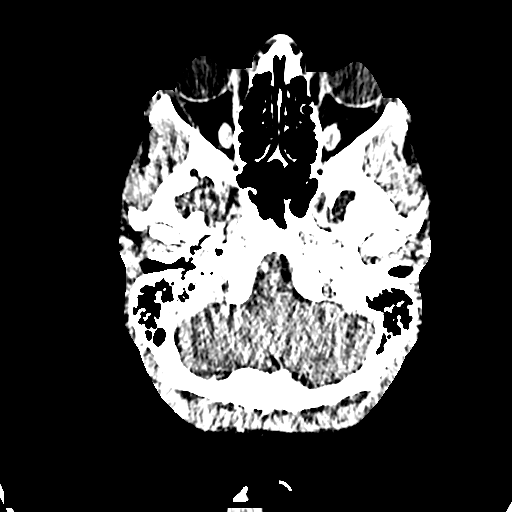
[im 67/266  bone]
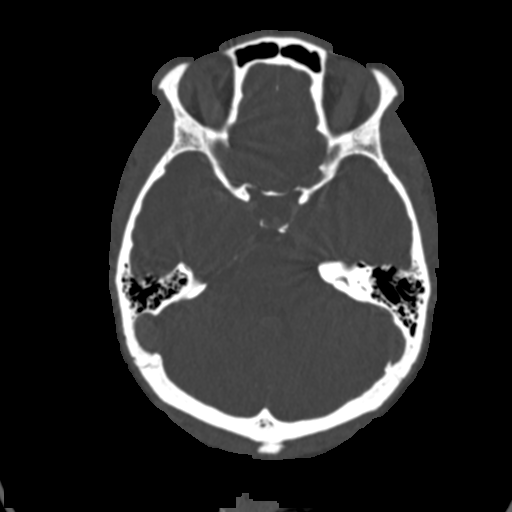
[im 80/266  brain]
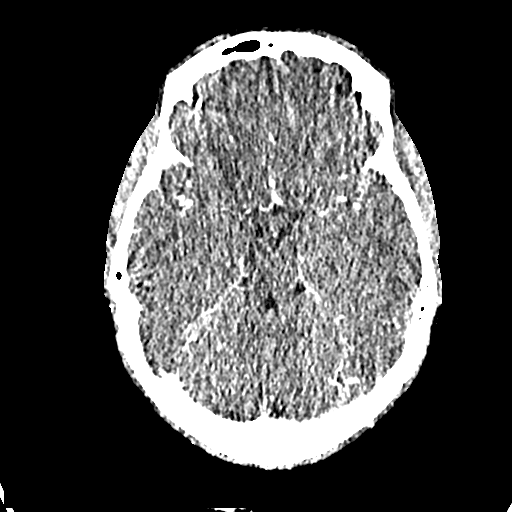
[im 93/266  bone]
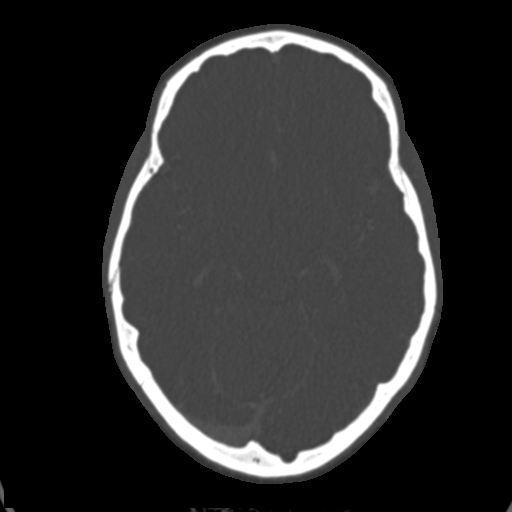
[im 107/266  brain]
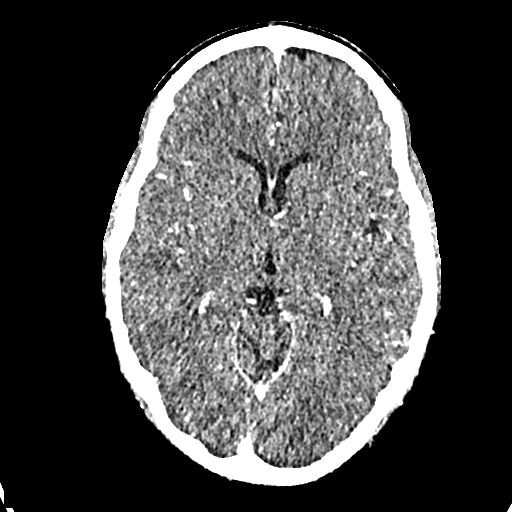
[im 120/266  bone]
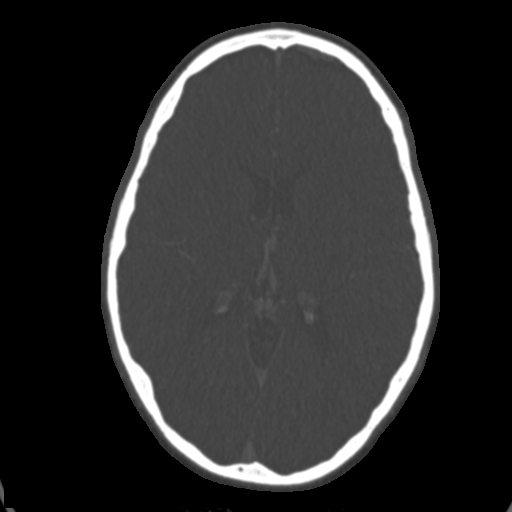
[im 146/266  brain]
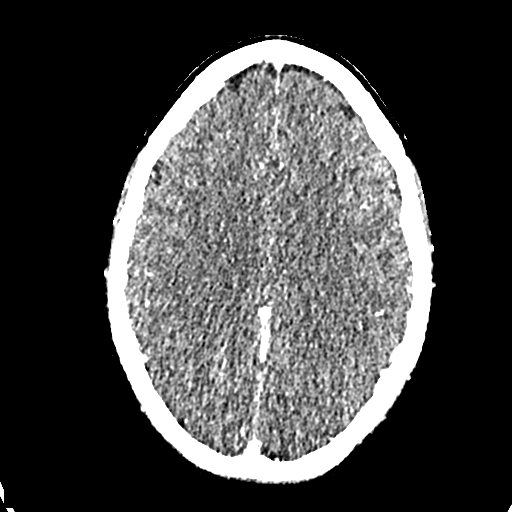
[im 160/266  bone]
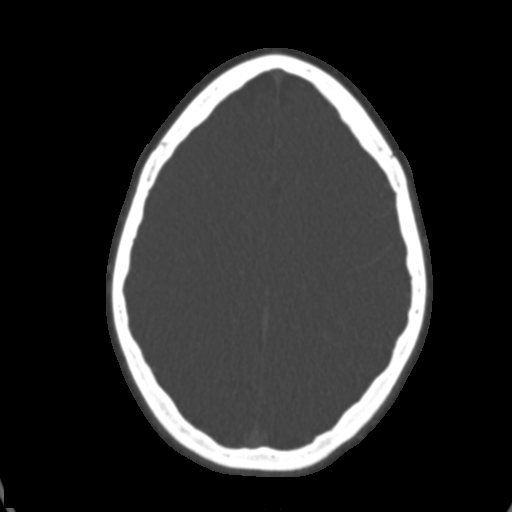
[im 173/266  brain]
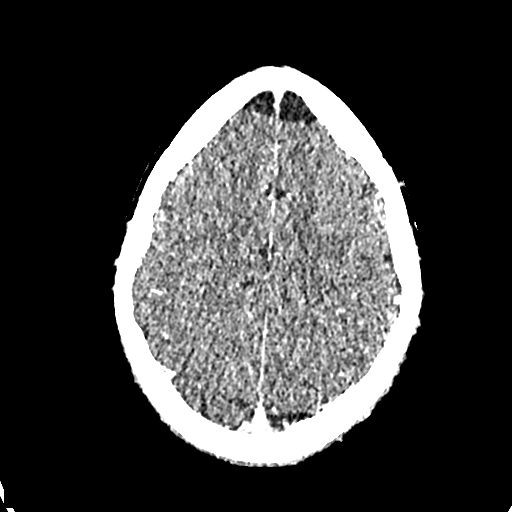
[im 186/266  bone]
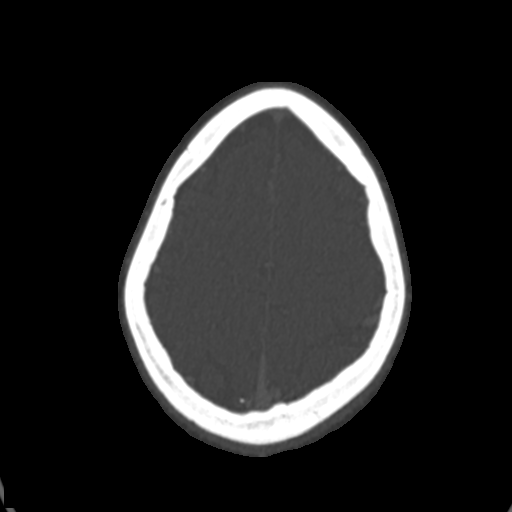
[im 199/266  brain]
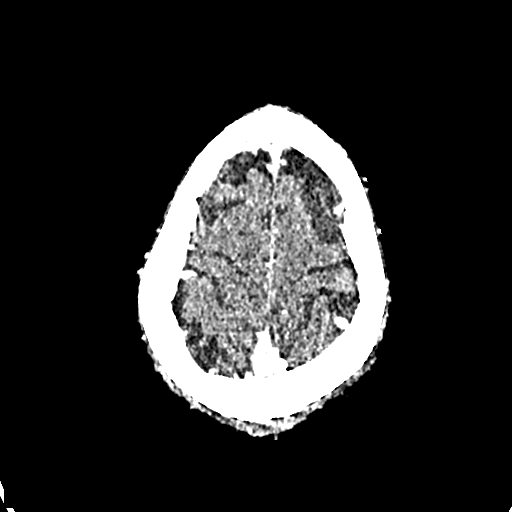
[im 226/266  bone]
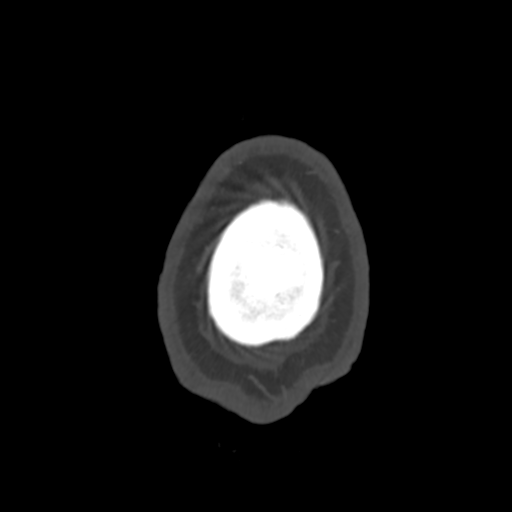
[im 239/266  brain]
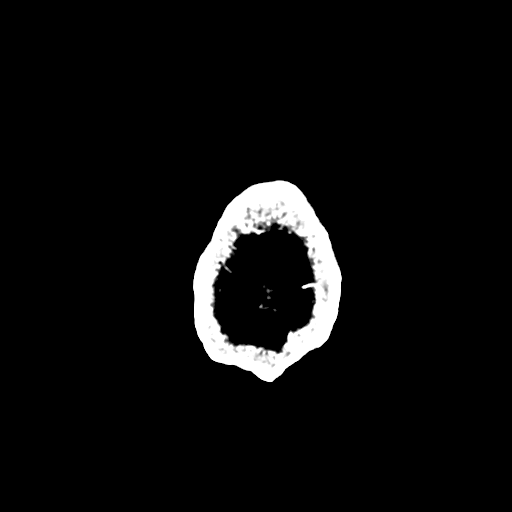
[im 252/266  bone]
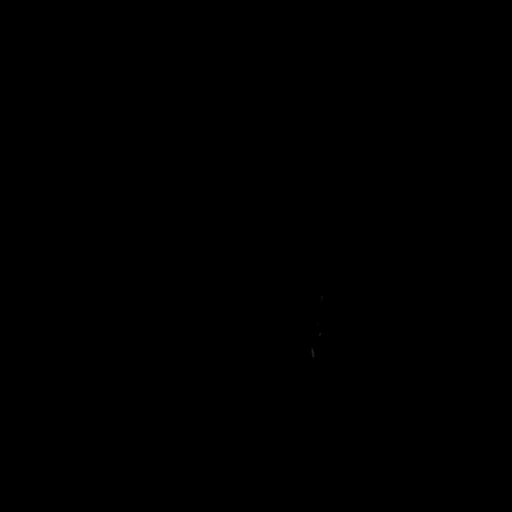

[Series 12: head 2.00 hr40 sag sag mpr ibhc · sagittal · 0.31mm/px · 1 of 104 slices shown]
[im 52/104  brain]
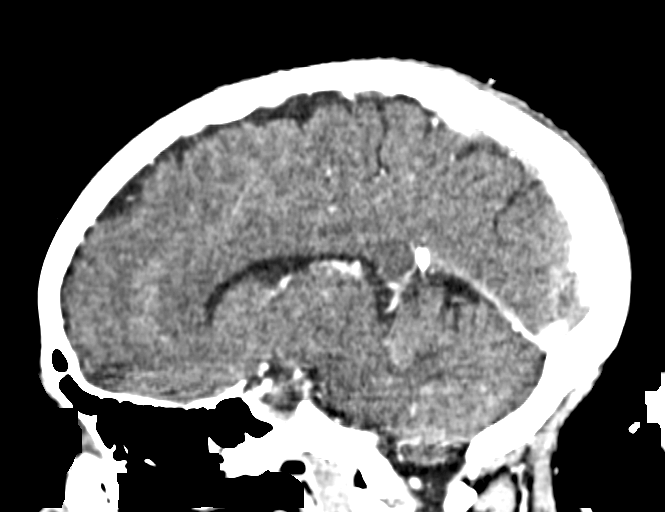

[Series 19: head 1.50 hr40 s3 cor ibhc · coronal · 0.28mm/px · 2 of 131 slices shown]
[im 44/131  brain]
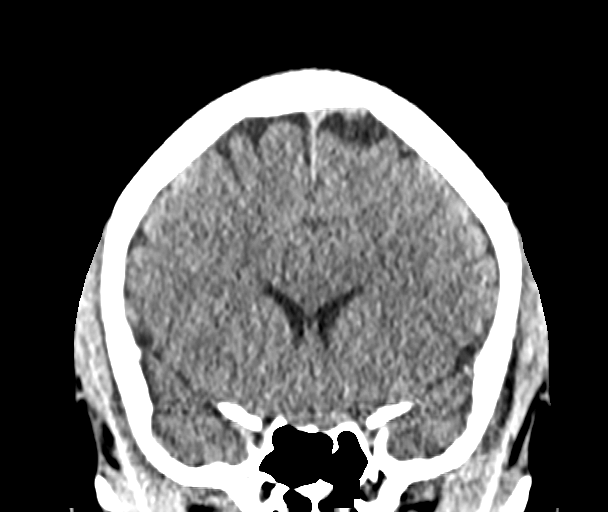
[im 87/131  brain]
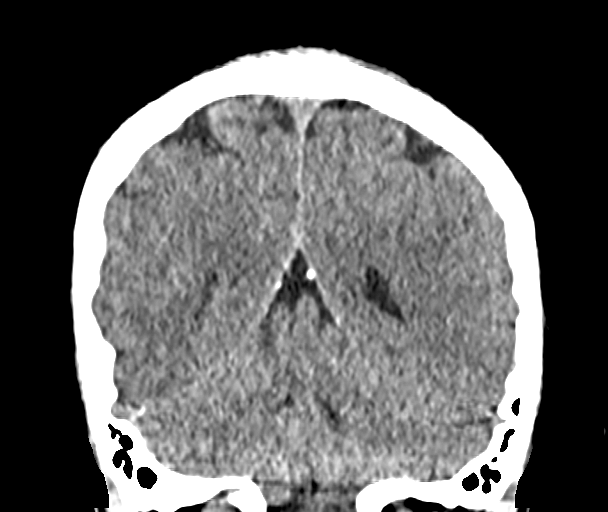

[19 of 47 positions shown; findings below may reference images not displayed]

FINDINGS: The left transverse sigmoid system is markedly hypoplastic, which
correlates with the size of the associated jugular foramen. There is
a focal advanced narrowing of the mid right transverse sinus, see
coronal reformats image 74. The superior sagittal sinus and the
paired veins are well opacified. There is symmetric unremarkable
enhancement at the cavernous sinuses.

The brain itself shows no infarct, hemorrhage, hydrocephalus, or
mass.
IMPRESSION: Focal advanced narrowing of the mid right transverse sinus. The left
transverse/sigmoid system is congenitally hypoplastic. These
findings correlate with history of intracranial hypertension.

## 2019-10-28 ENCOUNTER — Other Ambulatory Visit: Payer: Self-pay | Admitting: Neurology

## 2019-10-28 DIAGNOSIS — G932 Benign intracranial hypertension: Secondary | ICD-10-CM

## 2019-11-18 ENCOUNTER — Other Ambulatory Visit: Payer: Self-pay | Admitting: Neurology

## 2019-11-18 DIAGNOSIS — G932 Benign intracranial hypertension: Secondary | ICD-10-CM

## 2020-02-28 ENCOUNTER — Other Ambulatory Visit: Payer: Self-pay | Admitting: Neurology

## 2020-02-28 DIAGNOSIS — G932 Benign intracranial hypertension: Secondary | ICD-10-CM

## 2020-03-03 NOTE — Telephone Encounter (Signed)
Called pt on home # and LVM (ok per DPR) advising we have received another refill request for diamox but since pt hasn't been seen since 2019 an appointment will be needed for further refills. I asked for a call back to schedule appointment. Left office number in message. When the pt calls back, please schedule her with a nurse practitioner.

## 2020-08-12 NOTE — Progress Notes (Signed)
[facility]    Provider:  Dr Lucia Gaskins Referring Provider: Ileana Ladd, MD Primary Care Physician:  Ileana Ladd, MD  CC:  Optic nerve head edema  08/12/2020: Interval history: Patient saw [place] in 2019 and never followed back up. Opening pressure was 30, she was started on Diamox. She was taking the diamox, she noticed that dairy was playing some part, around the same time she wanted to see how she was feeling off of the pill. She went to Dr. Modesto Charon and she was sent back here. Dr. Modesto Charon refilled her diamox. She restarted back because she had a 2/10 headache but never had any more severe headaches. She feels taking dairy our helped. Theonly thing she noticed is weight gain. She had a headache but again it was a 2/10 every once in a while and no real vision changes. In June she saw the ophthalmologist and there was no edema. The headaches have resolved since starting the diamox. We discussed decreasing dose.   HPI 06/12/2018:  Joanne Summers is a 34 y.o. female here as a referral from Dr. Modesto Charon for bilateral disc edema.  Vision in our office today is 20/20 OD and OS.  She was seen by ophthalmology and sent here for evaluation for pseudotumor cerebri. Last summer she noticed a lymph node on her neck, she started getting headaches, random aches, she had pressure and tingling and aches in the head, she went to the eye doctor and veins were enlarged. Feels pressure in the head, she has double vision, blurry vision, dizziness and spots. Presure os on the right side of the head. Worse with exertion. No fullness in the ears. No IUD. No long term use of antibiotics, retin or acutane. No migrainous features. Headaches daily. Can last all day and be positional. No other focal neurologic deficits, associated symptoms, inciting events or modifiable factors.  Reviewed notes, labs and imaging from outside physicians, which showed:  Reviewed [facility] notes.  34 year old female who  presented for pseudotumor cerebra in the right eye and left eye.  She went in her yearly eye exam with Dr. Katrinka Blazing and told her that her eyes were swollen in both eyes.  She does have a headache aching at the top of the head.  No pulsatile tinnitus.  She is on birth control she is 5 to 160 pounds.  No recent head trauma.  Reviewed results of his exam which include pupils equally round and reactive to light no APD, open angles, visual fields full, extraocular movements full adnexa normal lids and lashes normal slit-lamp normal but optic nerves did show disc prominence disc edema bilaterally diagnosed with optic disc edema.  Visual field testing was outside normal limits but I do not have the actual data.  Review of Systems: Patient complains of symptoms per HPI as well as the following symptoms: Headache, numbness, skin sensitivity. Pertinent negatives and positives per HPI. All others negative.   Social History   Socioeconomic History  . Marital status: Single    Spouse name: Not on file  . Number of children: Not on file  . Years of education: Not on file  . Highest education level: Master's degree (e.g., MA, MS, MEng, MEd, MSW, MBA)  Occupational History  . Not on file  Tobacco Use  . Smoking status: Never Smoker  . Smokeless tobacco: Never Used  Vaping Use  . Vaping Use: Never used  Substance and Sexual Activity  . Alcohol use: No  . Drug  use: No  . Sexual activity: Yes    Birth control/protection: None  Other Topics Concern  . Not on file  Social History Narrative   Lives at home alone   Right handed   Caffeine: sometimes maybe once or twice a week   Social Determinants of Health   Financial Resource Strain:   . Difficulty of Paying Living Expenses: Not on file  Food Insecurity:   . Worried About Programme researcher, broadcasting/film/video in the Last Year: Not on file  . Ran Out of Food in the Last Year: Not on file  Transportation Needs:   . Lack of Transportation (Medical): Not on file  . Lack  of Transportation (Non-Medical): Not on file  Physical Activity:   . Days of Exercise per Week: Not on file  . Minutes of Exercise per Session: Not on file  Stress:   . Feeling of Stress : Not on file  Social Connections:   . Frequency of Communication with Friends and Family: Not on file  . Frequency of Social Gatherings with Friends and Family: Not on file  . Attends Religious Services: Not on file  . Active Member of Clubs or Organizations: Not on file  . Attends Banker Meetings: Not on file  . Marital Status: Not on file  Intimate Partner Violence:   . Fear of Current or Ex-Partner: Not on file  . Emotionally Abused: Not on file  . Physically Abused: Not on file  . Sexually Abused: Not on file    Family History  Problem Relation Age of Onset  . Migraines Neg Hx     Past Medical History:  Diagnosis Date  . Headache   . PONV (postoperative nausea and vomiting)     Past Surgical History:  Procedure Laterality Date  . BREAST REDUCTION SURGERY    . LYMPH NODE DISSECTION      Current Outpatient Medications  Medication Sig Dispense Refill  . acetaZOLAMIDE (DIAMOX) 250 MG tablet Take 2 tablets (500 mg total) by mouth 2 (two) times daily. 360 tablet 3  . Cetirizine HCl (ZYRTEC PO) Take 1 each by mouth daily.    Marland Kitchen ELDERBERRY PO Take 1 each by mouth daily.     No current facility-administered medications for this visit.    Allergies as of 08/13/2020  . (No Known Allergies)    Vitals: BP 117/77 (BP Location: Right Arm, Patient Position: Sitting)   Pulse 76   Ht 5\' 2"  (1.575 m)   Wt 168 lb (76.2 kg)   BMI 30.73 kg/m  Last Weight:  Wt Readings from Last 1 Encounters:  08/13/20 168 lb (76.2 kg)   Last Height:   Ht Readings from Last 1 Encounters:  08/13/20 5\' 2"  (1.575 m)   Physical exam: Exam: Gen: NAD, conversant, well nourised, obese, well groomed                     CV: RRR, no MRG. No Carotid Bruits. No peripheral edema, warm,  nontender Eyes: Conjunctivae clear without exudates or hemorrhage  Neuro: Detailed Neurologic Exam  Speech:    Speech is normal; fluent and spontaneous with normal comprehension.  Cognition:    The patient is oriented to person, place, and time;     recent and remote memory intact;     language fluent;     normal attention, concentration,     fund of knowledge Cranial Nerves:    The pupils are equal, round, and reactive to  light. Proptosis of the globes. The fundi are normal. Visual fields are full to finger confrontation. Extraocular movements are intact. Trigeminal sensation is intact and the muscles of mastication are normal. The face is symmetric. The palate elevates in the midline. Hearing intact. Voice is normal. Shoulder shrug is normal. The tongue has normal motion without fasciculations.   Coordination:    Normal finger to nose and heel to shin. Normal rapid alternating movements.   Gait:    Heel-toe and tandem gait are normal.   Motor Observation:    No asymmetry, no atrophy, and no involuntary movements noted. Tone:    Normal muscle tone.    Posture:    Posture is normal. normal erect    Strength:    Strength is V/V in the upper and lower limbs.      Sensation: intact to LT     Reflex Exam:  DTR's:    Deep tendon reflexes in the upper and lower extremities are normal bilaterally.   Toes:    The toes are downgoing bilaterally.   Clonus:    Clonus is absent.       Assessment/Plan:  34 year old patient with positional daily intractable headaches, bilateral optic disc edema, episodes of vision changes and diplopia, dizziness and vertigo. Need MRI of the brain to evaluate for space occupying mass, severe chiari, intracranial hypertension or any other etiology. Exam also shows eye globe proptosis, will check thyroid panel and antibodies but also need MRI of the orbits to evaluate for compressive thyroid disease, orbital pseudotumor or other abnormalities causing  diplopia, eye proptosis and other concerning symptoms above. Will also order LP. If elevated opening pressure, will start diamox. If vision or an other symptoms changes acutely or anything concerning go to ED immediately. -If above workup shows idiopathic intracranial HTN, will need MRV of the brain to distinguish benign intracranial hypertension (pseudotumor cerebri) from dural sinus thrombosis.  - Improved - normal fundi today  - Opening pressure was 30, she was started on Diamox.Back for follow up, improved, she stopped the diamox for a while and headaches came back much les severe at 2/10. we will refill but she can decrease the diamox to 2 pills daily and then maybe 1 pill daily as she feels improved and did not have edema on eye exam despite being off of the medication for a little bit.   - continue regular follow up with optho   Meds ordered this encounter  Medications  . acetaZOLAMIDE (DIAMOX) 250 MG tablet    Sig: Take 2 tablets (500 mg total) by mouth 2 (two) times daily.    Dispense:  360 tablet    Refill:  3    Cc: Dr. Sherryll Burger, Dr. Timmie Foerster, MD  [facility] [address] [place], [place] [address]  Phone [phone] Fax [phone]  I spent over 20 minutes of face-to-face and non-face-to-face time with patient on the  1. IIH (idiopathic intracranial hypertension)    diagnosis.  This included previsit chart review, lab review, study review, order entry, electronic health record documentation, patient education on the different diagnostic and therapeutic options, counseling and coordination of care, risks and benefits of management, compliance, or risk factor reduction

## 2020-08-13 ENCOUNTER — Encounter: Payer: Self-pay | Admitting: Neurology

## 2020-08-13 ENCOUNTER — Ambulatory Visit: Payer: BC Managed Care – PPO | Admitting: Neurology

## 2020-08-13 DIAGNOSIS — G932 Benign intracranial hypertension: Secondary | ICD-10-CM | POA: Diagnosis not present

## 2020-08-13 MED ORDER — ACETAZOLAMIDE 250 MG PO TABS: 500.0000 mg | ORAL_TABLET | Freq: Two times a day (BID) | ORAL | 3 refills | Status: DC

## 2021-08-19 ENCOUNTER — Telehealth: Payer: Self-pay | Admitting: Neurology

## 2021-09-17 ENCOUNTER — Other Ambulatory Visit: Payer: Self-pay | Admitting: Neurology

## 2021-09-17 DIAGNOSIS — G932 Benign intracranial hypertension: Secondary | ICD-10-CM

## 2022-02-20 ENCOUNTER — Emergency Department (HOSPITAL_COMMUNITY)
Admission: EM | Admit: 2022-02-20 | Discharge: 2022-02-21 | Disposition: A | Discharge status: Home / Self Care | Payer: Self-pay | Attending: Emergency Medicine | Admitting: Emergency Medicine

## 2022-02-20 ENCOUNTER — Encounter (HOSPITAL_COMMUNITY): Payer: Self-pay | Admitting: Emergency Medicine

## 2022-02-20 DIAGNOSIS — N83201 Unspecified ovarian cyst, right side: Secondary | ICD-10-CM | POA: Insufficient documentation

## 2022-02-20 DIAGNOSIS — R102 Pelvic and perineal pain: Secondary | ICD-10-CM

## 2022-02-20 DIAGNOSIS — D72829 Elevated white blood cell count, unspecified: Secondary | ICD-10-CM | POA: Insufficient documentation

## 2022-02-20 LAB — PREGNANCY, URINE: Preg Test, Ur: NEGATIVE

## 2022-02-20 LAB — URINALYSIS, ROUTINE W REFLEX MICROSCOPIC
Bilirubin Urine: NEGATIVE
Glucose, UA: NEGATIVE mg/dL
Hgb urine dipstick: NEGATIVE
Ketones, ur: NEGATIVE mg/dL
Leukocytes,Ua: NEGATIVE
Nitrite: NEGATIVE
Protein, ur: NEGATIVE mg/dL
Specific Gravity, Urine: 1.006 (ref 1.005–1.030)
pH: 9 — ABNORMAL HIGH (ref 5.0–8.0)

## 2022-02-20 NOTE — ED Triage Notes (Addendum)
Patient c/o pelvic pain and pressure ongoing worsening today. States seen at GYN since with negative STD and pregnancy test. Denies dysuria. ?

## 2022-02-20 NOTE — ED Provider Triage Note (Signed)
Emergency Medicine Provider Triage Evaluation Note ? ?Joanne Summers , a 36 y.o. female  was evaluated in triage.  Pt complains of pelvic pain.  Pt had anegative pregnancy test and std testing last week  ? ?Review of Systems  ?Positive: Lower abdominal pain  ?Negative: fever ? ?Physical Exam  ?BP 128/80 (BP Location: Right Arm)   Pulse 88   Temp 98.2 ?F (36.8 ?C) (Oral)   Resp 18   LMP 01/30/2022   SpO2 97%  ?Gen:   Awake, no distress   ?Resp:  Normal effort  ?MSK:   Moves extremities without difficulty  ?Other:   ? ?Medical Decision Making  ?Medically screening exam initiated at 5:05 PM.  Appropriate orders placed.  Joanne Summers was informed that the remainder of the evaluation will be completed by another provider, this initial triage assessment does not replace that evaluation, and the importance of remaining in the ED until their evaluation is complete. ? ? ?  ?Fransico Meadow, [place] ?02/20/22 1706 ? ?

## 2022-02-21 ENCOUNTER — Emergency Department (HOSPITAL_COMMUNITY): Payer: Self-pay

## 2022-02-21 LAB — CBC WITH DIFFERENTIAL/PLATELET
Abs Immature Granulocytes: 0.04 10*3/uL (ref 0.00–0.07)
Basophils Absolute: 0.1 10*3/uL (ref 0.0–0.1)
Basophils Relative: 1 %
Eosinophils Absolute: 0.2 10*3/uL (ref 0.0–0.5)
Eosinophils Relative: 1 %
HCT: 37.3 % (ref 36.0–46.0)
Hemoglobin: 12 g/dL (ref 12.0–15.0)
Immature Granulocytes: 0 %
Lymphocytes Relative: 43 %
Lymphs Abs: 5.2 10*3/uL — ABNORMAL HIGH (ref 0.7–4.0)
MCH: 31.8 pg (ref 26.0–34.0)
MCHC: 32.2 g/dL (ref 30.0–36.0)
MCV: 98.9 fL (ref 80.0–100.0)
Monocytes Absolute: 1.1 10*3/uL — ABNORMAL HIGH (ref 0.1–1.0)
Monocytes Relative: 9 %
Neutro Abs: 5.7 10*3/uL (ref 1.7–7.7)
Neutrophils Relative %: 46 %
Platelets: 341 10*3/uL (ref 150–400)
RBC: 3.77 MIL/uL — ABNORMAL LOW (ref 3.87–5.11)
RDW: 12.9 % (ref 11.5–15.5)
WBC: 12.3 10*3/uL — ABNORMAL HIGH (ref 4.0–10.5)
nRBC: 0 % (ref 0.0–0.2)

## 2022-02-21 LAB — COMPREHENSIVE METABOLIC PANEL
ALT: 21 U/L (ref 0–44)
AST: 24 U/L (ref 15–41)
Albumin: 3.9 g/dL (ref 3.5–5.0)
Alkaline Phosphatase: 48 U/L (ref 38–126)
Anion gap: 6 (ref 5–15)
BUN: 13 mg/dL (ref 6–20)
CO2: 19 mmol/L — ABNORMAL LOW (ref 22–32)
Calcium: 8.7 mg/dL — ABNORMAL LOW (ref 8.9–10.3)
Chloride: 114 mmol/L — ABNORMAL HIGH (ref 98–111)
Creatinine, Ser: 0.88 mg/dL (ref 0.44–1.00)
GFR, Estimated: >60 mL/min (ref >60)
Glucose, Bld: 84 mg/dL (ref 70–99)
Potassium: 3.9 mmol/L (ref 3.5–5.1)
Sodium: 139 mmol/L (ref 135–145)
Total Bilirubin: 0.3 mg/dL (ref 0.3–1.2)
Total Protein: 7.1 g/dL (ref 6.5–8.1)

## 2022-02-21 LAB — WET PREP, GENITAL
Clue Cells Wet Prep HPF POC: NONE SEEN
Sperm: NONE SEEN
Trich, Wet Prep: NONE SEEN
WBC, Wet Prep HPF POC: <10 (ref <10)
Yeast Wet Prep HPF POC: NONE SEEN

## 2022-02-21 MED ORDER — KETOROLAC TROMETHAMINE 30 MG/ML IJ SOLN
30.0000 mg | Freq: Once | INTRAMUSCULAR | Status: DC
Start: 2022-02-21 — End: 2022-02-21

## 2022-02-21 NOTE — Discharge Instructions (Addendum)
Your lab work and evaluation today is overall been reassuring, we do see evidence of a right-sided ovarian cyst on your ultrasound.  You should have a follow-up ultrasound of this cyst within the next 6 to 12 weeks.  Please call to schedule close follow-up regarding your pelvic pain OB/GYN.  For pain you may take ibuprofen and Tylenol as needed. ? ?If you develop severe pain, vomiting or new or different pain please return for reevaluation. ?

## 2022-02-21 NOTE — ED Provider Notes (Signed)
?[facility]-EMERGENCY DEPT ?Provider Note ? ? ?CSN: [id number] ?Arrival date & time: 02/20/22  1633 ? ?  ? ?History ? ?Chief Complaint  ?Patient presents with  ? Pelvic Pain  ? ? ?Joanne Summers is a 36 y.o. female. ? ?Joanne Summers is a 36 y.o. female with a history of IIH, who presents for pelvic pain.  Patient reports that she has been experiencing this pain intermittently over the past 2 weeks.  She reports it is sometimes at midline and sometimes left-sided.  She describes it as a pressure and discomfort that is sometimes worse with movement.  No associated nausea or vomiting, no change in bowel habits.  No vaginal discharge.  She reports her last menstrual cycle was March 26 and was normal.  She reports [facility] went to [facility] where she had a negative pregnancy test and was treated for a yeast infection, she also followed up at the health department and had negative STD testing and she reports no new sexual partners recently.  She has continued to have intermittent discomfort, she reports that the pain was worse today and her left pelvic region and so she decided to come in to get evaluated.  She reports she has not had pain higher up into the abdomen. ? ?The history is provided by the patient.  ? ?  ? ?Home Medications ?Prior to Admission medications   ?Medication Sig Start Date End Date Taking? Authorizing Provider  ?acetaZOLAMIDE (DIAMOX) 250 MG tablet TAKE 2 TABLETS BY MOUTH 2 TIMES DAILY. ?Patient taking differently: Take 250 mg by mouth 2 (two) times daily. 09/23/21  Yes Anson Fret, MD  ?Cetirizine HCl (ZYRTEC PO) Take 10 mg by mouth daily as needed (for allergies).   Yes [provider]  ?   ? ?Allergies    ?Cefdinir   ? ?Review of Systems   ?Review of Systems  ?Constitutional:  Negative for chills and fever.  ?HENT: Negative.    ?Respiratory:  Negative for cough and shortness of breath.   ?Cardiovascular:  Negative for chest pain.  ?Gastrointestinal:   Negative for abdominal pain, constipation, diarrhea, nausea and vomiting.  ?Genitourinary:  Positive for pelvic pain. Negative for dysuria, flank pain, frequency, genital sores, hematuria, vaginal bleeding and vaginal discharge.  ?Musculoskeletal:  Negative for arthralgias and myalgias.  ?Skin:  Negative for color change and rash.  ?Neurological:  Negative for syncope and light-headedness.  ? ?Physical Exam ?Updated Vital Signs ?BP 131/81 (BP Location: Left Arm)   Pulse 79   Temp 98.2 ?F (36.8 ?C) (Oral)   Resp 16   LMP 01/30/2022   SpO2 97%  ?Physical Exam ?Vitals and nursing note reviewed.  ?Constitutional:   ?   General: She is not in acute distress. ?   Appearance: Normal appearance. She is well-developed. She is not ill-appearing or diaphoretic.  ?HENT:  ?   Head: Normocephalic and atraumatic.  ?Eyes:  ?   General:     ?   Right eye: No discharge.     ?   Left eye: No discharge.  ?Cardiovascular:  ?   Rate and Rhythm: Normal rate and regular rhythm.  ?   Pulses: Normal pulses.  ?   Heart sounds: Normal heart sounds. No murmur heard. ?  No friction rub. No gallop.  ?Pulmonary:  ?   Effort: Pulmonary effort is normal. No respiratory distress.  ?   Breath sounds: Normal breath sounds. No wheezing or rales.  ?  Comments: Respirations equal and unlabored, patient able to speak in full sentences, lungs clear to auscultation bilaterally  ?Abdominal:  ?   General: Bowel sounds are normal. There is no distension.  ?   Palpations: Abdomen is soft. There is no mass.  ?   Tenderness: There is no abdominal tenderness. There is no guarding.  ?   Comments: Abdomen soft, nondistended, nontender to palpation in all quadrants without guarding or peritoneal signs, there is only mild tenderness in the left pelvic region.  ?Genitourinary: ?   Comments: Chaperone present during pelvic exam. ?Normal external genitalia, no lesions noted. ?On speculum exam, no discharge or bleeding noted, cervix appears normal ?On bimanual exam  patient with no cervical motion tenderness or uterine tenderness, no right adnexal tenderness or palpable masses, there is some mild left adnexal tenderness ?Musculoskeletal:     ?   General: No deformity.  ?   Cervical back: Neck supple.  ?Skin: ?   General: Skin is warm and dry.  ?   Capillary Refill: Capillary refill takes less than 2 seconds.  ?Neurological:  ?   Mental Status: She is alert and oriented to person, place, and time.  ?   Coordination: Coordination normal.  ?   Comments: Speech is clear, able to follow commands ?Moves extremities without ataxia, coordination intact  ?Psychiatric:     ?   Mood and Affect: Mood normal.     ?   Behavior: Behavior normal.  ? ? ?ED Results / Procedures / Treatments   ?Labs ?(all labs ordered are listed, but only abnormal results are displayed) ?Labs Reviewed  ?URINALYSIS, ROUTINE W REFLEX MICROSCOPIC - Abnormal; Notable for the following components:  ?    Result Value  ? Color, Urine STRAW (*)   ? pH 9.0 (*)   ? All other components within normal limits  ?COMPREHENSIVE METABOLIC PANEL - Abnormal; Notable for the following components:  ? Chloride 114 (*)   ? CO2 19 (*)   ? Calcium 8.7 (*)   ? All other components within normal limits  ?CBC WITH DIFFERENTIAL/PLATELET - Abnormal; Notable for the following components:  ? WBC 12.3 (*)   ? RBC 3.77 (*)   ? Lymphs Abs 5.2 (*)   ? Monocytes Absolute 1.1 (*)   ? All other components within normal limits  ?WET PREP, GENITAL  ?PREGNANCY, URINE  ?GC/CHLAMYDIA PROBE AMP ([facility] NOT [facility]  ? ? ?EKG ?None ? ?Radiology ?US PELVIC COMPLETE W TRANSVAGINAL AND TORSION R/O ? ?Result Date: 02/21/2022 ?CLINICAL DATA:  Initial evaluation for acute pelvic pain for 2 weeks. EXAM: TRANSABDOMINAL AND TRANSVAGINAL ULTRASOUND OF PELVIS DOPPLER ULTRASOUND OF OVARIES TECHNIQUE: Both transabdominal and transvaginal ultrasound examinations of the pelvis were performed. Transabdominal technique was performed for global imaging of the pelvis  including uterus, ovaries, adnexal regions, and pelvic cul-de-sac. It was necessary to proceed with endovaginal exam following the transabdominal exam to visualize the endometrium and ovaries. Color and duplex Doppler ultrasound was utilized to evaluate blood flow to the ovaries. COMPARISON:  None available. FINDINGS: Uterus Measurements: 7.9 x 3.6 x 4.4 cm = volume: 64.5 mL. Uterus is anteverted. No discrete fibroid or other myometrial abnormality. Few small nabothian cysts noted about the cervix. Endometrium Thickness: 5.5 mm.  No focal abnormality visualized. Right ovary Measurements: 5.1 x 4.2 x 3.9 cm = volume: 44.0 mL. 4.0 x 3.9 x 3.8 cm minimally complex cyst seen arising from the right ovary. Cyst demonstrates a predominantly simple anechoic echotexture,  however, there is an apparent small focus of internal mural nodular echogenicity (image 71). No internal vascularity. Left ovary Measurements: 2.8 x 2.6 x 2.2 cm = volume: 8.2 mL. Normal appearance/no adnexal mass. Pulsed Doppler evaluation of both ovaries demonstrates normal low-resistance arterial and venous waveforms. Other findings Small volume free fluid seen within the pelvis. IMPRESSION: 1. 4.0 cm minimally complex right ovarian cyst with a small focus of mural based nodular echogenicity. While this is favored to be benign, a short interval follow-up ultrasound in 6-12 weeks to ensure resolution is recommended, as a cystic ovarian neoplasm could potentially have this appearance. 2. Small volume free fluid within the pelvis, nonspecific, but most commonly physiologic. 3. Otherwise unremarkable and normal pelvic ultrasound. No evidence for ovarian torsion. Electronically Signed   By: Rise Mu M.D.   On: 02/21/2022 03:24   ? ?Procedures ?Procedures  ? ? ?Medications Ordered in ED ?Medications  ?ketorolac (TORADOL) 30 MG/ML injection 30 mg (0 mg Intravenous Hold 02/21/22 0359)  ? ? ?ED Course/ Medical Decision Making/ A&P ?  ?                         ?Medical Decision Making ?Amount and/or Complexity of Data Reviewed ?Labs: ordered. ?Radiology: ordered. ? ?Risk ?Prescription drug management. ? ? ?Patient presents to the ED with complaints of pelvic pain. Pati

## 2022-02-22 LAB — GC/CHLAMYDIA PROBE AMP (~~LOC~~) NOT AT ARMC
Chlamydia: NEGATIVE
Comment: NEGATIVE
Comment: NORMAL
Neisseria Gonorrhea: NEGATIVE

## 2022-08-16 ENCOUNTER — Other Ambulatory Visit: Payer: Self-pay | Admitting: Neurology

## 2022-08-16 DIAGNOSIS — G932 Benign intracranial hypertension: Secondary | ICD-10-CM

## 2022-08-17 IMAGING — US US PELVIS COMPLETE TRANSABD/TRANSVAG W DUPLEX AND/OR DOPPLER
1 series · 14 of 25 positions shown · non-contrast
Comparison: None available.

CLINICAL DATA: Initial evaluation for acute pelvic pain for 2
weeks.

EXAM:
TRANSABDOMINAL AND TRANSVAGINAL ULTRASOUND OF PELVIS
DOPPLER ULTRASOUND OF OVARIES
TECHNIQUE: Both transabdominal and transvaginal ultrasound examinations of the
pelvis were performed. Transabdominal technique was performed for
global imaging of the pelvis including uterus, ovaries, adnexal
regions, and pelvic cul-de-sac.
It was necessary to proceed with endovaginal exam following the
transabdominal exam to visualize the endometrium and ovaries. Color
and duplex Doppler ultrasound was utilized to evaluate blood flow to
the ovaries.

[Series 1: us transvaginal non-ob mc & wl · 14 of 106 slices shown]
[im 1/106]
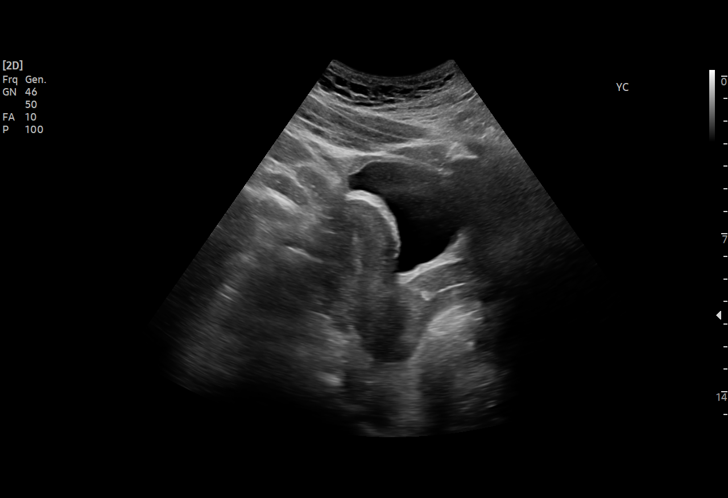
[im 9/106]
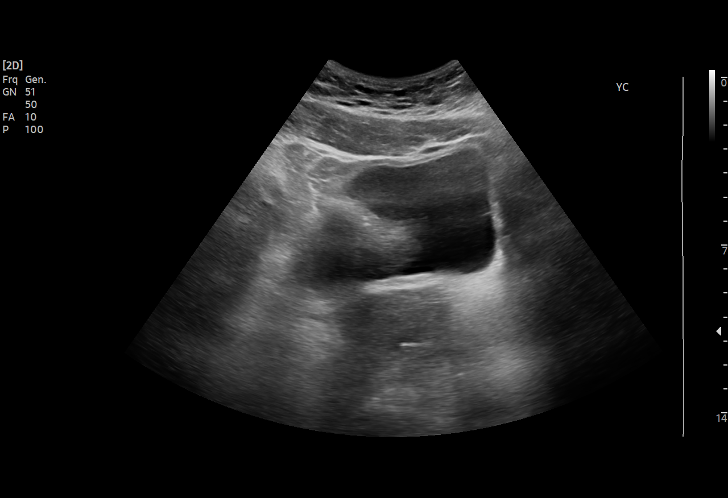
[im 18/106]
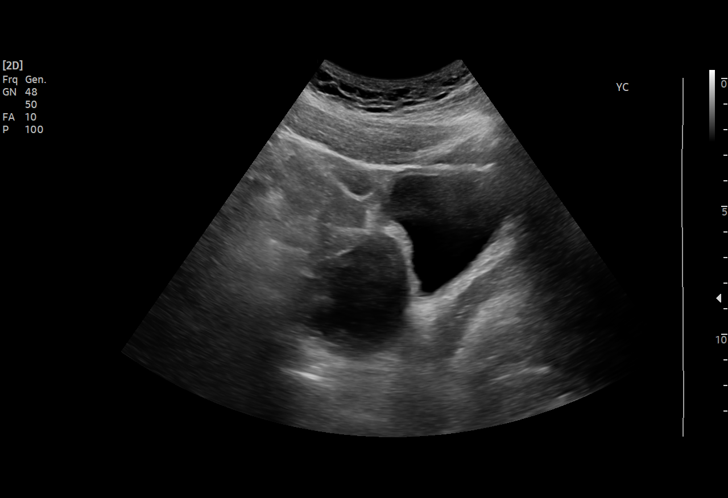
[im 27/106]
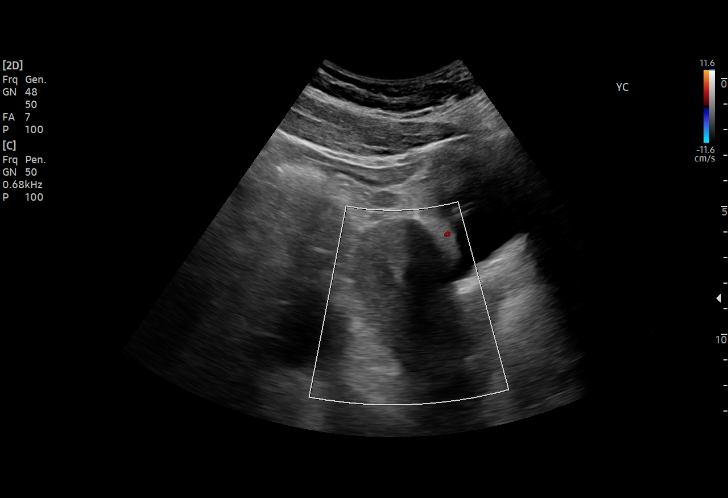
[im 36/106]
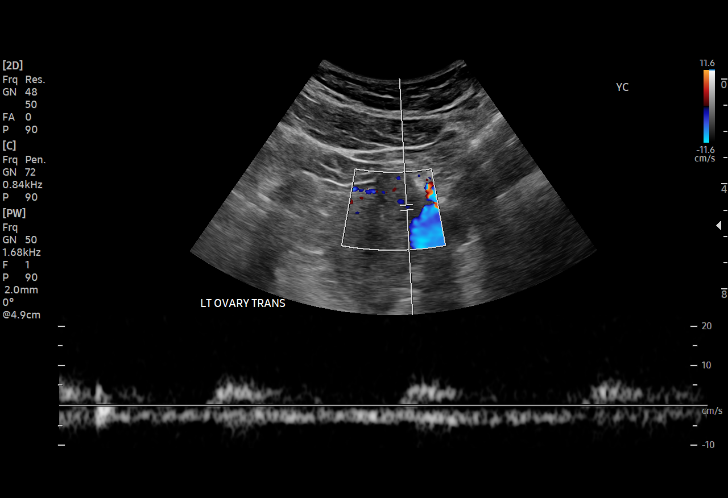
[im 40/106]
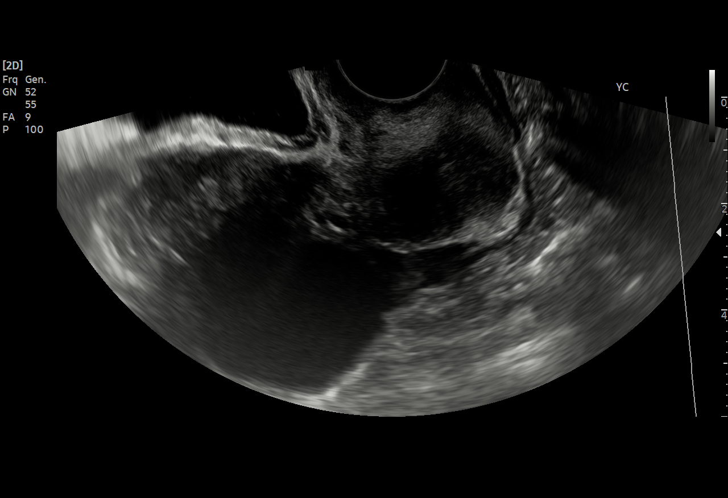
[im 49/106]
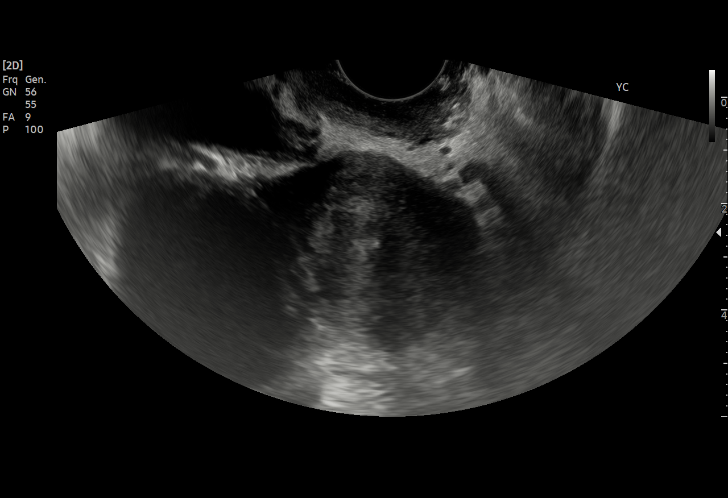
[im 57/106]
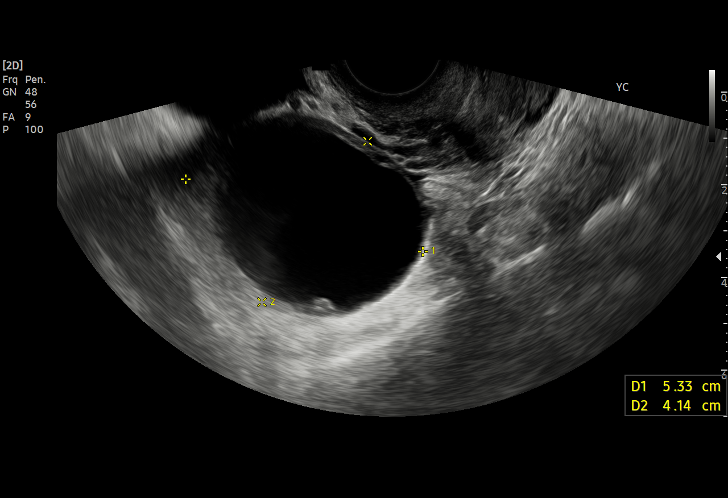
[im 66/106]
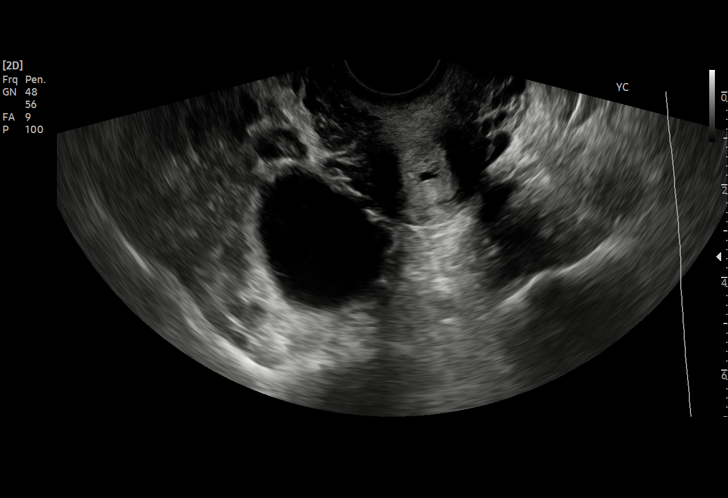
[im 71/106]
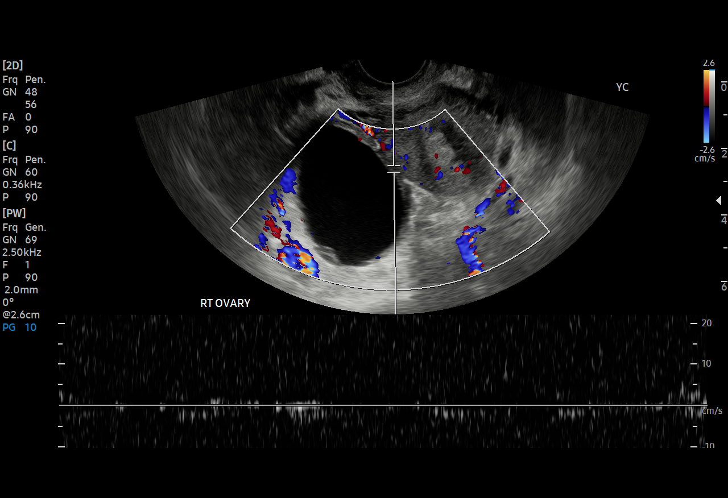
[im 79/106]
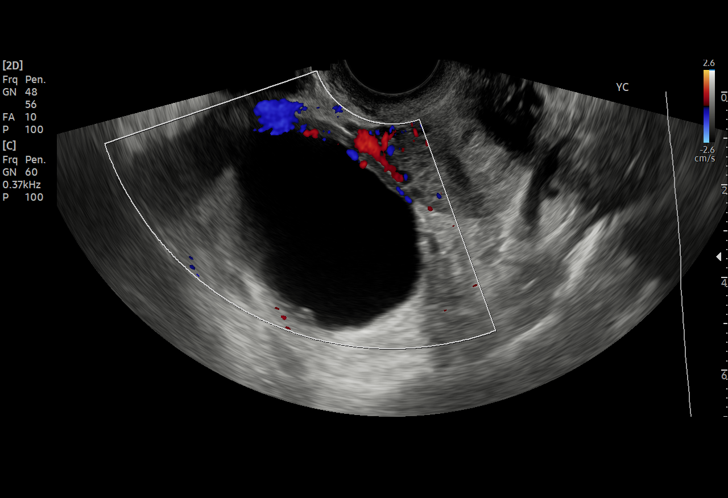
[im 88/106]
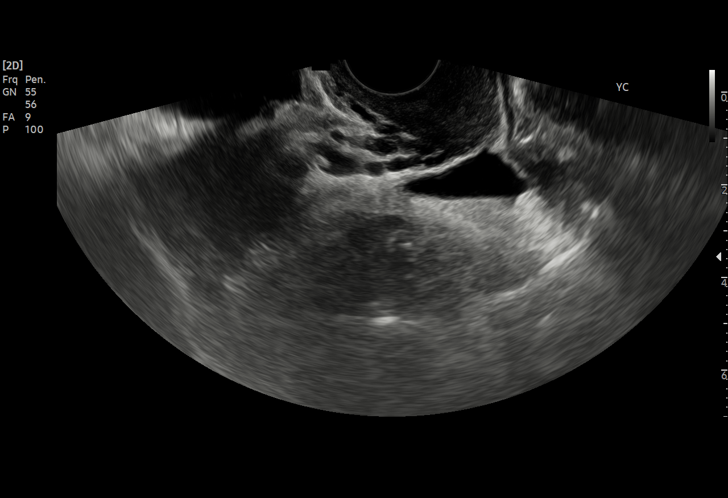
[im 97/106]
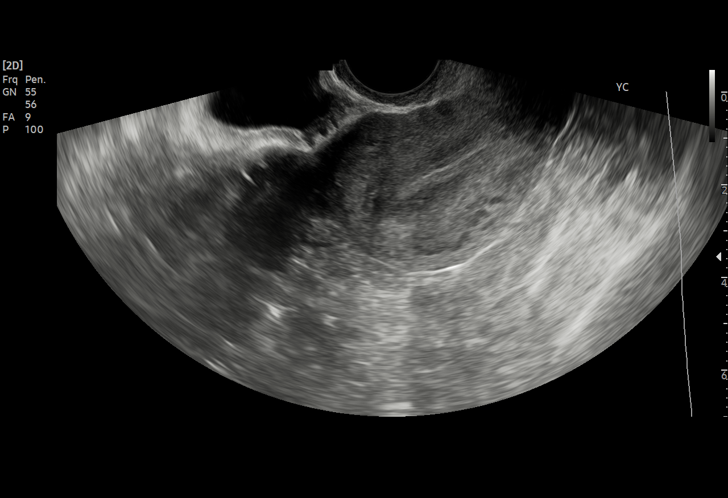
[im 106/106]
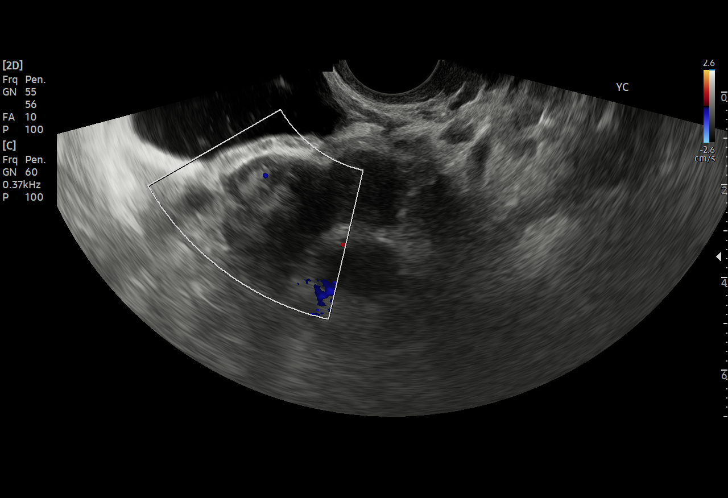

[14 of 25 positions shown; findings below may reference images not displayed]

FINDINGS: Uterus

Measurements: 7.9 x 3.6 x 4.4 cm = volume: 64.5 mL. Uterus is
anteverted. No discrete fibroid or other myometrial abnormality. Few
small nabothian cysts noted about the cervix.

Endometrium

Thickness: 5.5 mm.  No focal abnormality visualized.

Right ovary

Measurements: 5.1 x 4.2 x 3.9 cm = volume: 44.0 mL. 4.0 x 3.9 x
cm minimally complex cyst seen arising from the right ovary. Cyst
demonstrates a predominantly simple anechoic echotexture, however,
there is an apparent small focus of internal mural nodular
echogenicity (image 71). No internal vascularity.

Left ovary

Measurements: 2.8 x 2.6 x 2.2 cm = volume: 8.2 mL. Normal
appearance/no adnexal mass.

Pulsed Doppler evaluation of both ovaries demonstrates normal
low-resistance arterial and venous waveforms.

Other findings

Small volume free fluid seen within the pelvis.
IMPRESSION: 1. 4.0 cm minimally complex right ovarian cyst with a small focus of
mural based nodular echogenicity. While this is favored to be
benign, a short interval follow-up ultrasound in 6-12 weeks to
ensure resolution is recommended, as a cystic ovarian neoplasm could
potentially have this appearance.
2. Small volume free fluid within the pelvis, nonspecific, but most
commonly physiologic.
3. Otherwise unremarkable and normal pelvic ultrasound. No evidence
for ovarian torsion.

## 2022-08-29 ENCOUNTER — Other Ambulatory Visit (HOSPITAL_COMMUNITY)
Admission: RE | Admit: 2022-08-29 | Discharge: 2022-08-29 | Disposition: A | Discharge status: Home / Self Care | Payer: Commercial Managed Care - HMO | Source: Ambulatory Visit | Attending: Obstetrics and Gynecology | Admitting: Obstetrics and Gynecology

## 2022-08-29 ENCOUNTER — Ambulatory Visit (HOSPITAL_BASED_OUTPATIENT_CLINIC_OR_DEPARTMENT_OTHER): Admission: RE | Admit: 2022-08-29 | Payer: Commercial Managed Care - HMO | Source: Ambulatory Visit

## 2022-08-29 ENCOUNTER — Ambulatory Visit: Payer: Commercial Managed Care - HMO | Admitting: Obstetrics and Gynecology

## 2022-08-29 ENCOUNTER — Ambulatory Visit (HOSPITAL_BASED_OUTPATIENT_CLINIC_OR_DEPARTMENT_OTHER)
Admission: RE | Admit: 2022-08-29 | Discharge: 2022-08-29 | Disposition: A | Discharge status: Home / Self Care | Payer: Commercial Managed Care - HMO | Source: Ambulatory Visit | Attending: Obstetrics and Gynecology | Admitting: Obstetrics and Gynecology

## 2022-08-29 ENCOUNTER — Ambulatory Visit (HOSPITAL_BASED_OUTPATIENT_CLINIC_OR_DEPARTMENT_OTHER): Payer: Commercial Managed Care - HMO

## 2022-08-29 ENCOUNTER — Encounter: Payer: Self-pay | Admitting: Obstetrics and Gynecology

## 2022-08-29 VITALS — BP 126/80 | HR 88 | Ht 62.0 in | Wt 177.0 lb

## 2022-08-29 DIAGNOSIS — Z01419 Encounter for gynecological examination (general) (routine) without abnormal findings: Secondary | ICD-10-CM | POA: Insufficient documentation

## 2022-08-29 DIAGNOSIS — N83201 Unspecified ovarian cyst, right side: Secondary | ICD-10-CM | POA: Insufficient documentation

## 2022-08-29 NOTE — Progress Notes (Signed)
Subjective:     Joanne Summers is a 36 y.o. female P0 with LMP 08/16/22 and BMI 32 who is here for a comprehensive physical exam. The patient reports no problems. She is not currently sexually active. She is not on any birth control. She reports a monthly 2-3 day period. She denies abnormal discharge. She admits to intermittent left lower quadrant pain and states she was diagnosed with an ovarian cyst in April 2023. She describes the pain as sharp and intermittent. When present, the pain may last for a few weeks and is exacerbated by prolonged sitting and relieves with NSAID. Patient is without any other complaints  Past Medical History:  Diagnosis Date   Headache    PONV (postoperative nausea and vomiting)    Past Surgical History:  Procedure Laterality Date   BREAST REDUCTION SURGERY     LYMPH NODE DISSECTION     Family History  Problem Relation Age of Onset   Migraines Neg Hx     Social History   Socioeconomic History   Marital status: Single    Spouse name: Not on file   Number of children: Not on file   Years of education: Not on file   Highest education level: Master's degree (e.g., MA, MS, MEng, MEd, MSW, MBA)  Occupational History   Not on file  Tobacco Use   Smoking status: Never   Smokeless tobacco: Never  Vaping Use   Vaping Use: Never used  Substance and Sexual Activity   Alcohol use: No   Drug use: No   Sexual activity: Yes    Birth control/protection: None  Other Topics Concern   Not on file  Social History Narrative   Lives at home alone   Right handed   Caffeine: sometimes maybe once or twice a week   Social Determinants of Health   Financial Resource Strain: Not on file  Food Insecurity: Not on file  Transportation Needs: Not on file  Physical Activity: Not on file  Stress: Not on file  Social Connections: Not on file  Intimate Partner Violence: Not on file   Health Maintenance  Topic Date Due   COVID-19 Vaccine (1) Never done   HIV  Screening  Never done   Hepatitis C Screening  Never done   TETANUS/TDAP  Never done   PAP SMEAR-Modifier  Never done   INFLUENZA VACCINE  Never done   HPV VACCINES  Aged Out       Review of Systems Pertinent items noted in HPI and remainder of comprehensive ROS otherwise negative.   Objective:  Blood pressure 126/80, pulse 88, height 5\' 2"  (1.575 m), weight 177 lb (80.3 kg), last menstrual period 08/16/2022.   GENERAL: Well-developed, well-nourished female in no acute distress.  HEENT: Normocephalic, atraumatic. Sclerae anicteric.  NECK: Supple. Normal thyroid.  LUNGS: Clear to auscultation bilaterally.  HEART: Regular rate and rhythm. BREASTS: Symmetric in size. No palpable masses or lymphadenopathy, skin changes, or nipple drainage. ABDOMEN: Soft, nontender, nondistended. No organomegaly. PELVIC: Normal external female genitalia. Vagina is pink and rugated.  Normal discharge. Normal appearing cervix. Uterus is normal in size. No adnexal mass or tenderness. Chaperone present during the pelvic exam EXTREMITIES: No cyanosis, clubbing, or edema, 2+ distal pulses.     Assessment:    Healthy female exam.      Plan:    Pap smear collected STI screening per patient requests Pelvic ultrasound ordered to follow up on right ovarian cyst Patient will be contacted with abnormal  results See After Visit Summary for Counseling Recommendations

## 2022-08-30 LAB — HIV ANTIBODY (ROUTINE TESTING W REFLEX): HIV Screen 4th Generation wRfx: NONREACTIVE

## 2022-08-30 LAB — HEPATITIS C ANTIBODY: Hep C Virus Ab: NONREACTIVE

## 2022-08-30 LAB — CYTOLOGY - PAP
Comment: NEGATIVE
Diagnosis: NEGATIVE
High risk HPV: NEGATIVE

## 2022-08-30 LAB — CERVICOVAGINAL ANCILLARY ONLY
Chlamydia: NEGATIVE
Comment: NEGATIVE
Comment: NORMAL
Neisseria Gonorrhea: NEGATIVE

## 2022-08-30 LAB — HEPATITIS B SURFACE ANTIGEN: Hepatitis B Surface Ag: NEGATIVE

## 2022-08-30 LAB — RPR: RPR Ser Ql: NONREACTIVE

## 2022-11-14 DIAGNOSIS — R1032 Left lower quadrant pain: Secondary | ICD-10-CM | POA: Diagnosis not present

## 2022-11-15 DIAGNOSIS — G932 Benign intracranial hypertension: Secondary | ICD-10-CM | POA: Diagnosis not present

## 2022-11-15 DIAGNOSIS — E7800 Pure hypercholesterolemia, unspecified: Secondary | ICD-10-CM | POA: Diagnosis not present

## 2022-11-15 DIAGNOSIS — Z23 Encounter for immunization: Secondary | ICD-10-CM | POA: Diagnosis not present

## 2022-11-15 DIAGNOSIS — R1032 Left lower quadrant pain: Secondary | ICD-10-CM | POA: Diagnosis not present

## 2022-11-15 DIAGNOSIS — Z0000 Encounter for general adult medical examination without abnormal findings: Secondary | ICD-10-CM | POA: Diagnosis not present

## 2022-11-16 ENCOUNTER — Other Ambulatory Visit: Payer: Self-pay | Admitting: Family Medicine

## 2022-11-16 DIAGNOSIS — R1032 Left lower quadrant pain: Secondary | ICD-10-CM

## 2022-11-22 ENCOUNTER — Ambulatory Visit
Admission: RE | Admit: 2022-11-22 | Discharge: 2022-11-22 | Disposition: A | Discharge status: Home / Self Care | Payer: BC Managed Care – PPO | Source: Ambulatory Visit | Attending: Family Medicine | Admitting: Family Medicine

## 2022-11-22 DIAGNOSIS — N8312 Corpus luteum cyst of left ovary: Secondary | ICD-10-CM | POA: Diagnosis not present

## 2022-11-22 DIAGNOSIS — R1030 Lower abdominal pain, unspecified: Secondary | ICD-10-CM | POA: Diagnosis not present

## 2022-11-22 DIAGNOSIS — R1032 Left lower quadrant pain: Secondary | ICD-10-CM

## 2022-11-22 MED ORDER — IOPAMIDOL (ISOVUE-300) INJECTION 61%
100.0000 mL | Freq: Once | INTRAVENOUS | Status: DC | PRN
Start: 2022-11-22 — End: 2022-11-22

## 2022-11-22 MED ORDER — IOPAMIDOL (ISOVUE-300) INJECTION 61%
100.0000 mL | Freq: Once | INTRAVENOUS | Status: AC | PRN
  Administered 2022-11-22: 100 mL via INTRAVENOUS

## 2023-01-10 ENCOUNTER — Other Ambulatory Visit: Payer: Self-pay

## 2023-01-10 ENCOUNTER — Emergency Department (HOSPITAL_COMMUNITY)
Admission: EM | Admit: 2023-01-10 | Discharge: 2023-01-10 | Disposition: A | Discharge status: Home / Self Care | Payer: BC Managed Care – PPO | Attending: Emergency Medicine | Admitting: Emergency Medicine

## 2023-01-10 ENCOUNTER — Encounter (HOSPITAL_COMMUNITY): Payer: Self-pay | Admitting: Emergency Medicine

## 2023-01-10 ENCOUNTER — Emergency Department (HOSPITAL_COMMUNITY): Payer: BC Managed Care – PPO

## 2023-01-10 ENCOUNTER — Ambulatory Visit
Admission: EM | Admit: 2023-01-10 | Discharge: 2023-01-10 | Disposition: A | Discharge status: Home / Self Care | Payer: BC Managed Care – PPO

## 2023-01-10 DIAGNOSIS — I10 Essential (primary) hypertension: Secondary | ICD-10-CM | POA: Diagnosis not present

## 2023-01-10 DIAGNOSIS — R200 Anesthesia of skin: Secondary | ICD-10-CM

## 2023-01-10 DIAGNOSIS — R9431 Abnormal electrocardiogram [ECG] [EKG]: Secondary | ICD-10-CM | POA: Diagnosis not present

## 2023-01-10 DIAGNOSIS — R202 Paresthesia of skin: Secondary | ICD-10-CM | POA: Diagnosis not present

## 2023-01-10 DIAGNOSIS — R2990 Unspecified symptoms and signs involving the nervous system: Secondary | ICD-10-CM

## 2023-01-10 DIAGNOSIS — R29818 Other symptoms and signs involving the nervous system: Secondary | ICD-10-CM | POA: Diagnosis not present

## 2023-01-10 LAB — CBC
HCT: 37.7 % (ref 36.0–46.0)
Hemoglobin: 12.1 g/dL (ref 12.0–15.0)
MCH: 31 pg (ref 26.0–34.0)
MCHC: 32.1 g/dL (ref 30.0–36.0)
MCV: 96.7 fL (ref 80.0–100.0)
Platelets: 380 10*3/uL (ref 150–400)
RBC: 3.9 MIL/uL (ref 3.87–5.11)
RDW: 12.1 % (ref 11.5–15.5)
WBC: 8.2 10*3/uL (ref 4.0–10.5)
nRBC: 0 % (ref 0.0–0.2)

## 2023-01-10 LAB — COMPREHENSIVE METABOLIC PANEL
ALT: 28 U/L (ref 0–44)
AST: 25 U/L (ref 15–41)
Albumin: 4 g/dL (ref 3.5–5.0)
Alkaline Phosphatase: 46 U/L (ref 38–126)
Anion gap: 7 (ref 5–15)
BUN: 11 mg/dL (ref 6–20)
CO2: 22 mmol/L (ref 22–32)
Calcium: 8.6 mg/dL — ABNORMAL LOW (ref 8.9–10.3)
Chloride: 106 mmol/L (ref 98–111)
Creatinine, Ser: 0.85 mg/dL (ref 0.44–1.00)
GFR, Estimated: >60 mL/min (ref >60)
Glucose, Bld: 108 mg/dL — ABNORMAL HIGH (ref 70–99)
Potassium: 3.9 mmol/L (ref 3.5–5.1)
Sodium: 135 mmol/L (ref 135–145)
Total Bilirubin: 0.4 mg/dL (ref 0.3–1.2)
Total Protein: 7.6 g/dL (ref 6.5–8.1)

## 2023-01-10 LAB — TSH: TSH: 2.001 u[IU]/mL (ref 0.350–4.500)

## 2023-01-10 LAB — DIFFERENTIAL
Abs Immature Granulocytes: 0.02 10*3/uL (ref 0.00–0.07)
Basophils Absolute: 0.1 10*3/uL (ref 0.0–0.1)
Basophils Relative: 1 %
Eosinophils Absolute: 0.1 10*3/uL (ref 0.0–0.5)
Eosinophils Relative: 2 %
Immature Granulocytes: 0 %
Lymphocytes Relative: 31 %
Lymphs Abs: 2.6 10*3/uL (ref 0.7–4.0)
Monocytes Absolute: 0.7 10*3/uL (ref 0.1–1.0)
Monocytes Relative: 8 %
Neutro Abs: 4.8 10*3/uL (ref 1.7–7.7)
Neutrophils Relative %: 58 %

## 2023-01-10 LAB — PROTIME-INR
INR: 1.1 (ref 0.8–1.2)
Prothrombin Time: 13.8 seconds (ref 11.4–15.2)

## 2023-01-10 LAB — I-STAT CHEM 8, ED
BUN: 11 mg/dL (ref 6–20)
Calcium, Ion: 1.19 mmol/L (ref 1.15–1.40)
Chloride: 104 mmol/L (ref 98–111)
Creatinine, Ser: 0.8 mg/dL (ref 0.44–1.00)
Glucose, Bld: 100 mg/dL — ABNORMAL HIGH (ref 70–99)
HCT: 39 % (ref 36.0–46.0)
Hemoglobin: 13.3 g/dL (ref 12.0–15.0)
Potassium: 4.3 mmol/L (ref 3.5–5.1)
Sodium: 138 mmol/L (ref 135–145)
TCO2: 25 mmol/L (ref 22–32)

## 2023-01-10 LAB — I-STAT BETA HCG BLOOD, ED (MC, WL, AP ONLY): I-stat hCG, quantitative: <5 m[IU]/mL (ref <5)

## 2023-01-10 MED ORDER — GADOBUTROL 1 MMOL/ML IV SOLN
8.0000 mL | Freq: Once | INTRAVENOUS | Status: AC | PRN
  Administered 2023-01-10: 8 mL via INTRAVENOUS

## 2023-01-10 NOTE — ED Notes (Signed)
Patient transported to CT 

## 2023-01-10 NOTE — ED Notes (Signed)
Dc instructions reviewed with pt. Pt understands the importance of following up with neuro, opthalmology and pcp. Pt will make all necessary appointment. No questions or concerns at this time. Denied need of wheelchair and ambulated out of ED.

## 2023-01-10 NOTE — Progress Notes (Signed)
Telestroke Note   [id number]: Code stroke activated at this time. Patient on camera and began to provide symptoms, LKW, and history at this time. Per patient ROS was 0830. LKW  unknown/unclear. mRS 0. Patient already evaluated by EDP and CT already obtained at time of cart activation.   1201: Dr. Curly Shores paged at this time.   1204: Dr. Curly Shores on camera at this time. Dr. Curly Shores performing neuro assessment at this time.   1214: Code stroke canceled by Dr. Curly Shores at this time.    Berenice Bouton Telestroke RN

## 2023-01-10 NOTE — Consult Note (Addendum)
[facility] Telemedicine Consult   Requesting Provider: Dr. Sharlett Iles Consult Participants: Myself, bedside nurse Huel Cote) Bobby Rumpf, atrium nurse Glenda Location of the provider: [facility] Location of the patient[facility]  This consult was provided via telemedicine with 2-way video and audio communication. The patient/family was informed that care would be provided in this way and agreed to receive care in this manner.    Chief Complaint: Right facial numbness  HPI: This is a 37 year old woman with a past medical history of recently diagnosed hyperlipidemia, BMI 31.09, remote intracranial idiopathic hypertension not currently on Diamox, presenting with a right facial tingling/numbness.  She reports she is sure that she was fine at 4:30 AM when she used the bathroom in the early hours of the morning.  She went back to sleep and woke up and got ready for work, not paying much attention to her face until she was at work and noticed that she was having some right tongue and lip numbness that then spread up to her face and is now more periorbital/frontal.  She denies any other focal neurological symptoms other than mild ringing in her left ear.  Regarding her history of IIH, she was found to have bilateral papilledema in 2019 on yearly eye exam, and had an opening pressure of 30 on lumbar puncture but denies ever having had significant headache reporting only "some ache in the head."  She reports that she did lose 20 pounds after the initial diagnosis but has likely gained this back.  However she has not had any vision symptoms or return of the aching feeling in her head, and she has been off of Diamox since 2022.  Last follow-up with neurology was 08/13/2020  LKW: 4 AM Thrombolytic given?: No, out of the window IR Thrombectomy? No, exam not consistent with large vessel occlusion Modified Rankin Scale: 0-Completely asymptomatic and back to baseline  post- stroke Time of teleneurologist evaluation: 12:03  Exam: Vitals:   01/10/23 1059 01/10/23 1150  BP: 130/86 119/80  Pulse: 97 75  Resp: 20 15  Temp: 98.2 F (36.8 C)   SpO2: 98% 97%    General: No acute distress Pulmonary: breathing comfortably Cardiac: regular rate and rhythm on monitor   NIH Stroke scale 1A: Level of Consciousness - 0 1B: Ask Month and Age - 0 1C: 'Blink Eyes' & 'Squeeze Hands' - 0 2: Test Horizontal Extraocular Movements - 0 3: Test Visual Fields - 0 4: Test Facial Palsy - 0 5A: Test Left Arm Motor Drift - 0 5B: Test Right Arm Motor Drift - 0 6A: Test Left Leg Motor Drift - 0 6B: Test Right Leg Motor Drift - 00 7: Test Limb Ataxia - 0 8: Test Sensation - 1 9: Test Language/Aphasia- 0 10: Test Dysarthria - 0 11: Test Extinction/Inattention - 0 NIHSS score: 1 for sensory loss in the right face only   Imaging Reviewed:   Head CT personally reviewed, agree with radiology no acute intracranial process  Labs reviewed in epic and pertinent values follow:   Basic Metabolic Panel: Recent Labs  Lab 01/10/23 1129 01/10/23 1135  NA 135 138  K 3.9 4.3  CL 106 104  CO2 22  --   GLUCOSE 108* 100*  BUN 11 11  CREATININE 0.85 0.80  CALCIUM 8.6*  --     CBC: Recent Labs  Lab 01/10/23 1129 01/10/23 1135  WBC 8.2  --   NEUTROABS 4.8  --   HGB 12.1 13.3  HCT 37.7 39.0  MCV 96.7  --   PLT 380  --     Coagulation Studies: Recent Labs    01/10/23 1129  LABPROT 13.8  INR 1.1     Assessment: Given patient's age and risk factors, as well as description of symptoms with gradual migration from her tongue and lips to her forehead, stroke is felt to be less likely an etiology.  Given her symptoms of prior lymphadenopathy, I do think an MRI brain with and without contrast to assess for inflammatory etiologies of her symptoms is reasonable (multiple sclerosis, sarcoidosis for example can cause these kinds of symptoms).  Additionally on review of  the her chart her symptoms of IIH were fairly mild and given that she has gained weight again and is having some ringing in her left ear, I do think close outpatient follow-up with ophthalmology is indicated; I do not think she needs an emergent lumbar puncture based on her very mild symptoms at this time.   Recommendations:  -MRI brain with and without contrast -Close outpatient follow-up with ophthalmology for funduscopic evaluation and visual field testing -She will follow-up MRI brain, if this is reassuring, no further inpatient testing and close outpatient follow-up with Dr. Lavell Anchors  This patient is receiving care for possible acute neurological changes. There was 25 minutes of care by this provider at the time of service, including time for direct evaluation via telemedicine, review of medical records, imaging studies and discussion of findings with providers, the patient and/or family.  Recommendations conveyed to Dr. Sharlett Iles, ED provider, via phone, and plan discussed with patient via video; she is comfortable with this plan and all of her questions were answered  Lesleigh Noe MD-PhD [facility] [phone]   If 8pm-8am, please page neurology on call as listed in [place].  CRITICAL CARE Performed by: Lorenza Chick   Total critical care time: 30 minutes  Critical care time was exclusive of separately billable procedures and treating other patients.  Critical care was necessary to treat or prevent imminent or life-threatening deterioration, emergent evaluation with consideration of thrombectomy or thrombolytic.  Critical care was time spent personally by me on the following activities: development of treatment plan with patient and/or surrogate as well as nursing, discussions with consultants, evaluation of patient's response to treatment, examination of patient, obtaining history from patient or surrogate, ordering and performing treatments and interventions,  ordering and review of laboratory studies, ordering and review of radiographic studies, pulse oximetry and re-evaluation of patient's condition.

## 2023-01-10 NOTE — ED Provider Notes (Signed)
[facility]    CSN: [id number] Arrival date & time: 01/10/23  0955      History   Chief Complaint Chief Complaint  Patient presents with   Allergic Reaction    HPI Joanne Summers is a 37 y.o. female.   37 year old female presents with right facial numbness.  Patient indicates this morning after she drank some store-bought tea she started having right facial numbness which she indicates it started around the right side of the lips moving up the right cheek and traveling up to just outside the right eye.  She indicates that she is not have any swelling, itching, tongue swelling.  She indicates she is not having any wheezing or difficulty breathing.  Patient is without vision changes, fever or chills.  Patient indicates there is no burning sensation just the numbness that is occurring.  Patient indicates she does not have any numbness of the upper extremities or lower extremities.  She is without headache, nausea or vomiting.   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 Authorizing Provider  acetaZOLAMIDE (DIAMOX) 250 MG tablet TAKE 2 TABLETS BY MOUTH TWICE A DAY 08/17/22   Melvenia Beam, MD  Cetirizine HCl (ZYRTEC PO) Take 10 mg by mouth daily as needed (for allergies).    [provider]    Family  History Family History  Problem Relation Age of Onset   Cancer Maternal Grandfather    Migraines Neg Hx     Social History Social History   Tobacco Use   Smoking status: Never   Smokeless tobacco: Never  Vaping Use   Vaping Use: Never used  Substance Use Topics   Alcohol use: Yes    Comment: occassionally   Drug use: No     Allergies   Cefdinir   Review of Systems Review of Systems  Neurological:  Positive for facial asymmetry (right facial numbness).     Physical Exam Triage Vital Signs ED Triage Vitals [01/10/23 1013]  Enc Vitals Group     BP 118/79     Pulse Rate (!) 101     Resp 18     Temp 98 F (36.7 C)     Temp Source Oral     SpO2 98 %     Weight      Height      Head Circumference      Peak Flow      Pain Score 0     Pain Loc  Pain Edu?      Excl. in [place]?    No data found.  Updated Vital Signs BP 118/79 (BP Location: Left Arm)   Pulse (!) 101   Temp 98 F (36.7 C) (Oral)   Resp 18   LMP 12/25/2022 (Approximate)   SpO2 98%   Visual Acuity Right Eye Distance:   Left Eye Distance:   Bilateral Distance:    Right Eye Near:   Left Eye Near:    Bilateral Near:     Physical Exam Constitutional:      Appearance: Normal appearance.  HENT:     Right Ear: Tympanic membrane and ear canal normal.     Left Ear: Tympanic membrane and ear canal normal.     Mouth/Throat:     Mouth: Mucous membranes are moist.     Pharynx: Oropharynx is clear. Uvula midline.     Comments: Facial: when the patient sticks the tongue out there is a mild deviation to the patient's left.  Facial expressions appear normal with smiling, raising of the eyebrows, and squinting.  These appear equal on both sides. Cardiovascular:     Rate and Rhythm: Normal rate and regular rhythm.     Heart sounds: Normal heart sounds.  Pulmonary:     Effort: Pulmonary effort is normal.     Breath sounds: Normal breath sounds and air entry. No wheezing, rhonchi or rales.   Neurological:     General: No focal deficit present.     Mental Status: She is alert.     Cranial Nerves: Cranial nerves 2-12 are intact.     Motor: Motor function is intact.     Coordination: Coordination is intact.     Gait: Gait is intact.      UC Treatments / Results  Labs (all labs ordered are listed, but only abnormal results are displayed) Labs Reviewed - No data to display  EKG   Radiology No results found.  Procedures Procedures (including critical care time)  Medications Ordered in UC Medications - No data to display  Initial Impression / Assessment and Plan / UC Course  I have reviewed the triage vital signs and the nursing notes.  Pertinent labs & imaging results that were available during my care of the patient were reviewed by me and considered in my medical decision making (see chart for details).    Plan: The diagnosis will be treated with the following: 1.  Right facial numbness: A.  Patient referred to emergency room for higher level care and evaluation. 2.  Advised to follow-up PCP return to urgent care as needed.  Final Clinical Impressions(s) / UC Diagnoses   Final diagnoses:  Right facial numbness     Discharge Instructions      Advised to report to the emergency room for higher level of care due to progressing right facial numbness.    ED Prescriptions   None    PDMP not reviewed this encounter.   Nyoka Lint, PA-C 01/10/23 1034

## 2023-01-10 NOTE — ED Notes (Signed)
Code Stroke cancelled.  

## 2023-01-10 NOTE — Discharge Instructions (Signed)
Advised to report to the emergency room for higher level of care due to progressing right facial numbness.

## 2023-01-10 NOTE — ED Notes (Signed)
Pt transported to CT ?

## 2023-01-10 NOTE — ED Notes (Signed)
Neurologist on monitor assessing pt

## 2023-01-10 NOTE — ED Triage Notes (Signed)
Patient sent from UC for further evaluation of right sided tingling of tongue and lip onset of 9am. Equal sensation to both sides of face. No neuro deficits noted in triage.

## 2023-01-10 NOTE — Discharge Instructions (Signed)
You were seen for your facial numbness in the emergency department.   At home, please follow-up with an ophthalmologist to have your optic nerves checked to see if your intracranial hypertension has recurred.    Follow-up with your primary doctor in 2-3 days regarding your visit.  Follow-up with neurology in 1 week regarding her symptoms.  Return immediately to the emergency department if you experience any of the following: Weakness of your arms, legs face, slurred speech, or any other concerning symptoms.    Thank you for visiting our Emergency Department. It was a pleasure taking care of you today.

## 2023-01-10 NOTE — ED Provider Notes (Signed)
[place] EMERGENCY DEPARTMENT AT [facility] Provider Note   CSN: [id number] Arrival date & time: 01/10/23  1052     History {Add pertinent medical, surgical, social history, OB history to HPI:1} Chief Complaint  Patient presents with   Numbness    ALAINA PANDOLFO is a 37 y.o. female.  37 year old female with a history of IIH previously on Diamox who presented to the emergency department with right-sided facial numbness.  Started at 9 AM when she was awake.  Says it started near her lips and then moved up the side of her face by her eye.  Not on blood thinners.  Denies any headaches.  Has not taken her Diamox in over a year.  No weakness or numbness or slurred speech.  Did have similar event several months ago that spontaneously resolved.       Home Medications Prior to Admission medications   Medication Sig Start Date End Date Taking? Authorizing Provider  acetaZOLAMIDE (DIAMOX) 250 MG tablet TAKE 2 TABLETS BY MOUTH TWICE A DAY 08/17/22   Melvenia Beam, MD  Cetirizine HCl (ZYRTEC PO) Take 10 mg by mouth daily as needed (for allergies).    [provider]      Allergies    Cefdinir    Review of Systems   Review of Systems  Physical Exam Updated Vital Signs BP 130/86 (BP Location: Right Arm)   Pulse 97   Temp 98.2 F (36.8 C) (Oral)   Resp 20   Ht '5\' 2"'$  (1.575 m)   Wt 77.1 kg   LMP 12/25/2022 (Approximate)   SpO2 98%   BMI 31.09 kg/m  Physical Exam Vitals and nursing note reviewed.  Constitutional:      General: She is not in acute distress.    Appearance: She is well-developed.  HENT:     Head: Normocephalic and atraumatic.     Right Ear: External ear normal.     Left Ear: External ear normal.     Nose: Nose normal.  Eyes:     Extraocular Movements: Extraocular movements intact.     Conjunctiva/sclera: Conjunctivae normal.     Pupils: Pupils are equal, round, and reactive to light.  Cardiovascular:     Rate and Rhythm: Normal  rate and regular rhythm.     Heart sounds: No murmur heard. Pulmonary:     Effort: Pulmonary effort is normal. No respiratory distress.  Musculoskeletal:     Cervical back: Normal range of motion and neck supple.  Skin:    General: Skin is warm and dry.  Neurological:     Mental Status: She is alert and oriented to person, place, and time.     Comments: NIHSS Exam  Level of Consciousness: Alert  LOC Questions: Answers Month and Age Correctly  LOC Commands: Opens and Closes Eyes and Hands on command  Best Gaze: Horizontal ocular movements intact  Visual Fields: No visual field loss  Facial Palsy: None  L Upper Extremity Motor: No drift after 10 seconds  R Upper Extremity Motor: No drift after 10 seconds  L Lower extremity Motor: No drift after 5 seconds  R Lower extremity Motor: No drift after 5 seconds  Ataxia: Absent  Sensory: Diminished sensation to right side of face in V1 through V3 distribution Best Language: No aphasia  Dysarthria: No dysarthria  Neglect: No visual or sensory neglect    Psychiatric:        Mood and Affect: Mood normal.  ED Results / Procedures / Treatments   Labs (all labs ordered are listed, but only abnormal results are displayed) Labs Reviewed - No data to display  EKG None  Radiology No results found.  Procedures Procedures   Medications Ordered in ED Medications - No data to display  ED Course/ Medical Decision Making/ A&P                             Medical Decision Making Amount and/or Complexity of Data Reviewed Labs: ordered. Radiology: ordered.   CHEROKEE BRUNICK is a 37 y.o. female with comorbidities that complicate the patient evaluation including ***   Initial Ddx:  ***   MDM:  ***  Plan:  ***  ED Summary/Re-evaluation:  ***  This patient presents to the ED for concern of complaints listed in HPI, this involves an extensive number of treatment options, and is a complaint that carries with it a high risk of  complications and morbidity. Disposition including potential need for admission considered.   Dispo: {Disposition:28069}  Additional history obtained from {Additional History:28067} Records reviewed {Records Reviewed:28068} The following labs were independently interpreted: {labs interpreted:28064} and show {lab findings:28250} I independently reviewed the following imaging with scope of interpretation limited to determining acute life threatening conditions related to emergency care: {imaging interpreted:28065} and agree with the radiologist interpretation with the following exceptions: *** I personally reviewed and interpreted cardiac monitoring: {cardiac monitoring:28251} I personally reviewed and interpreted the pt's EKG: see above for interpretation  I have reviewed the patients home medications and made adjustments as needed Consults: {Consultants:28063} Social Determinants of health:  ***  {Document critical care time when appropriate:1} {Document POCUS if Performed:1}   {Document critical care time when appropriate:1} {Document review of labs and clinical decision tools ie heart score, Chads2Vasc2 etc:1}  {Document your independent review of radiology images, and any outside records:1} {Document your discussion with family members, caretakers, and with consultants:1} {Document social determinants of health affecting pt's care:1} {Document your decision making why or why not admission, treatments were needed:1} Final Clinical Impression(s) / ED Diagnoses Final diagnoses:  None    Rx / DC Orders ED Discharge Orders     None

## 2023-01-10 NOTE — ED Triage Notes (Signed)
Pt reports tinging and itching on her right side that started this morning.  Denies taking any new medication at this time.

## 2023-02-07 ENCOUNTER — Institutional Professional Consult (permissible substitution): Payer: BC Managed Care – PPO | Admitting: Neurology

## 2023-02-13 ENCOUNTER — Encounter: Payer: Self-pay | Admitting: Neurology

## 2023-04-19 ENCOUNTER — Encounter: Payer: Self-pay | Admitting: Neurology

## 2023-04-19 ENCOUNTER — Ambulatory Visit: Payer: BC Managed Care – PPO | Admitting: Neurology

## 2023-04-19 VITALS — BP 128/81 | HR 98 | Ht 62.0 in | Wt 171.8 lb

## 2023-04-19 DIAGNOSIS — H4710 Unspecified papilledema: Secondary | ICD-10-CM | POA: Diagnosis not present

## 2023-04-19 DIAGNOSIS — G441 Vascular headache, not elsewhere classified: Secondary | ICD-10-CM | POA: Diagnosis not present

## 2023-04-19 DIAGNOSIS — E669 Obesity, unspecified: Secondary | ICD-10-CM

## 2023-04-19 DIAGNOSIS — G08 Intracranial and intraspinal phlebitis and thrombophlebitis: Secondary | ICD-10-CM | POA: Diagnosis not present

## 2023-04-19 DIAGNOSIS — G932 Benign intracranial hypertension: Secondary | ICD-10-CM | POA: Diagnosis not present

## 2023-04-19 DIAGNOSIS — Z6832 Body mass index (BMI) 32.0-32.9, adult: Secondary | ICD-10-CM

## 2023-04-19 MED ORDER — ACETAZOLAMIDE 250 MG PO TABS: 500.0000 mg | ORAL_TABLET | Freq: Two times a day (BID) | ORAL | 4 refills | Status: DC

## 2023-04-19 MED ORDER — ACETAZOLAMIDE ER 500 MG PO CP12: 500.0000 mg | ORAL_CAPSULE | Freq: Two times a day (BID) | ORAL | 4 refills | Status: DC

## 2023-04-19 NOTE — Progress Notes (Signed)
[facility]    Provider:  Dr Lucia Gaskins Referring Provider: Ollen Bowl, MD Primary Care Physician:  Ollen Bowl, MD  CC:  facial numbness  04/19/2023: Patient here for IDIOPATHIC INTRACRANIAL HYPERTENSION, we have not seen her since 2021. She is back on diamox and feeling well, went to the emergency room. I reviewed notes, she presented to the ED not on diamox with facial numbness and saw Dr. Iver Nestle. MRI was negative. She saw an eye doctor just recently. Restarted on diamox andd feeling better, no more facial numbness. Started in the lips and tongues and traveled up the face,Dr. Sena Hitch O.D. about a month ago. Primary started you on diamox, lost 20 pounds but gained it back. No headaches or vision changes. No other focal neurologic deficits, associated symptoms, inciting events or modifiable factors.    Reviewed notes, labs and imaging from outside physicians, which showed:  01/10/2023: IMPRESSION: Mild nonspecific T2/FLAIR hyperintensities in the white matter which appears similar to prior when allowing for differences in technique. No definite new white matter lesions identified. No enhancing lesions to suggest active demyelination  01/10/2023: TSH normal     Latest Ref Rng & Units 01/10/2023   11:35 AM 01/10/2023   11:29 AM 02/21/2022    3:28 AM  CBC  WBC 4.0 - 10.5 K/uL  8.2  12.3   Hemoglobin 12.0 - 15.0 g/dL 09.8  11.9  14.7   Hematocrit 36.0 - 46.0 % 39.0  37.7  37.3   Platelets 150 - 400 K/uL  380  341        Latest Ref Rng & Units 01/10/2023   11:35 AM 01/10/2023   11:29 AM 02/21/2022    3:28 AM  CMP  Glucose 70 - 99 mg/dL 829  562  84   BUN 6 - 20 mg/dL 11  11  13    Creatinine 0.44 - 1.00 mg/dL 1.30  8.65  7.84   Sodium 135 - 145 mmol/L 138  135  139   Potassium 3.5 - 5.1 mmol/L 4.3  3.9  3.9   Chloride 98 - 111 mmol/L 104  106  114   CO2 22 - 32 mmol/L  22  19   Calcium 8.9 - 10.3 mg/dL  8.6  8.7   Total Protein 6.5 - 8.1 g/dL  7.6  7.1    Total Bilirubin 0.3 - 1.2 mg/dL  0.4  0.3   Alkaline Phos 38 - 126 U/L  46  48   AST 15 - 41 U/L  25  24   ALT 0 - 44 U/L  28  21    Patient complains of symptoms per HPI as well as the following symptoms: none . Pertinent negatives and positives per HPI. All others negative      08/12/2020: Interval history: Patient saw [place] in 2019 and never followed back up. Opening pressure was 30, she was started on Diamox. She was taking the diamox, she noticed that dairy was playing some part, around the same time she wanted to see how she was feeling off of the pill. She went to Dr. Modesto Charon and she was sent back here. Dr. Modesto Charon refilled her diamox. She restarted back because she had a 2/10 headache but never had any more severe headaches. She feels taking dairy our helped. Theonly thing she noticed is weight gain. She had a headache but again it was a 2/10 every once in a while and no real vision changes. In June she saw  the ophthalmologist and there was no edema. The headaches have resolved since starting the diamox. We discussed decreasing dose.   HPI 06/12/2018:  Joanne Summers is a 37 y.o. female here as a referral from Dr. Jacqulyn Bath for bilateral disc edema.  Vision in our office today is 20/20 OD and OS.  She was seen by ophthalmology and sent here for evaluation for pseudotumor cerebri. Last summer she noticed a lymph node on her neck, she started getting headaches, random aches, she had pressure and tingling and aches in the head, she went to the eye doctor and veins were enlarged. Feels pressure in the head, she has double vision, blurry vision, dizziness and spots. Presure os on the right side of the head. Worse with exertion. No fullness in the ears. No IUD. No long term use of antibiotics, retin or acutane. No migrainous features. Headaches daily. Can last all day and be positional. No other focal neurologic deficits, associated symptoms, inciting events or modifiable factors.  Reviewed notes, labs and  imaging from outside physicians, which showed:  Reviewed [facility] notes.  37 year old female who presented for pseudotumor cerebra in the right eye and left eye.  She went in her yearly eye exam with Dr. Katrinka Blazing and told her that her eyes were swollen in both eyes.  She does have a headache aching at the top of the head.  No pulsatile tinnitus.  She is on birth control she is 5 to 160 pounds.  No recent head trauma.  Reviewed results of his exam which include pupils equally round and reactive to light no APD, open angles, visual fields full, extraocular movements full adnexa normal lids and lashes normal slit-lamp normal but optic nerves did show disc prominence disc edema bilaterally diagnosed with optic disc edema.  Visual field testing was outside normal limits but I do not have the actual data.  Review of Systems: Patient complains of symptoms per HPI as well as the following symptoms: Headache, numbness, skin sensitivity. Pertinent negatives and positives per HPI. All others negative.   Social History   Socioeconomic History   Marital status: Single    Spouse name: Not on file   Number of children: 0   Years of education: Not on file   Highest education level: Master's degree (e.g., MA, MS, MEng, MEd, MSW, MBA)  Occupational History   Not on file  Tobacco Use   Smoking status: Never   Smokeless tobacco: Never  Vaping Use   Vaping Use: Never used  Substance and Sexual Activity   Alcohol use: Not Currently    Comment: occassionally   Drug use: No   Sexual activity: Not Currently    Birth control/protection: None  Other Topics Concern   Not on file  Social History Narrative   Lives at home alone   Right handed   Caffeine: sometimes maybe once or twice a week   Social Determinants of Health   Financial Resource Strain: Not on file  Food Insecurity: Not on file  Transportation Needs: Not on file  Physical Activity: Not on file  Stress: Not on file  Social  Connections: Not on file  Intimate Partner Violence: Not on file    Family History  Problem Relation Age of Onset   Cancer Maternal Grandfather    Migraines Neg Hx     Past Medical History:  Diagnosis Date   Headache    PONV (postoperative nausea and vomiting)     Past Surgical History:  Procedure  Laterality Date   BREAST REDUCTION SURGERY     BREAST SURGERY     LYMPH NODE DISSECTION      Current Outpatient Medications  Medication Sig Dispense Refill   acetaZOLAMIDE ER (DIAMOX) 500 MG capsule Take 1 capsule (500 mg total) by mouth 2 (two) times daily. 180 capsule 4   Cetirizine HCl (ZYRTEC PO) Take 10 mg by mouth daily as needed (for allergies).     MELATONIN PO Take by mouth as needed.     No current facility-administered medications for this visit.    Allergies as of 04/19/2023 - Review Complete 04/19/2023  Allergen Reaction Noted   Cefdinir Other (See Comments) 11/12/2021    Vitals: BP 128/81   Pulse 98   Ht 5\' 2"  (1.575 m)   Wt 171 lb 12.8 oz (77.9 kg)   BMI 31.42 kg/m  Last Weight:  Wt Readings from Last 1 Encounters:  04/19/23 171 lb 12.8 oz (77.9 kg)   Last Height:   Ht Readings from Last 1 Encounters:  04/19/23 5\' 2"  (1.575 m)    Physical exam: Exam: Gen: NAD, conversant, well nourised, obese, well groomed                     CV: RRR, no MRG. No Carotid Bruits. No peripheral edema, warm, nontender Eyes: Conjunctivae clear without exudates or hemorrhage  Neuro: Detailed Neurologic Exam  Speech:    Speech is normal; fluent and spontaneous with normal comprehension.  Cognition:    The patient is oriented to person, place, and time;     recent and remote memory intact;     language fluent;     normal attention, concentration,     fund of knowledge Cranial Nerves:    The pupils are equal, round, and reactive to light. Left fundi slightly blurry marginesVisual fields are full to finger confrontation. Extraocular movements are intact. Trigeminal  sensation is intact and the muscles of mastication are normal. The face is symmetric. The palate elevates in the midline. Hearing intact. Voice is normal. Shoulder shrug is normal. The tongue has normal motion without fasciculations.   Coordination: nml  Gait: nml  Motor Observation:    No asymmetry, no atrophy, and no involuntary movements noted. Tone:    Normal muscle tone.    Posture:    Posture is normal. normal erect    Strength:    Strength is V/V in the upper and lower limbs.      Sensation: intact to LT     Reflex Exam:  DTR's:    Deep tendon reflexes in the upper and lower extremities are symmetrical bilaterally.   Toes:    The toes are downgoing bilaterally.   Clonus:    Clonus is absent.         Assessment/Plan:  37 year old patient with IDIOPATHIC INTRACRANIAL HYPERTENSION, she did not see [place] for 3 years, better on restarting diamox.   - referral to healthy weight and wellness  - get notes from Dr. Sena Hitch O.D  - Opening pressure was 30, in 2019 she was started on Diamox.Back for follow up haven't seen her in 3 years, improved face numbness on diamox,   - continue regular follow up with optho   -CTV for thrombosis or stenosis    Weight loss is critical, discussed weight loss and risk of permanent vision loss  Copay is expensive, she can see [place] in a year if she is stable and follows with eye  doctor regularly, see [place] sooner if needed.   For any acute change especially worsening headache or vision loss call 911 and proceed to ED  To prevent or relieve headaches, try the following: Cool Compress. Lie down and place a cool compress on your head.  Avoid headache triggers. If certain foods or odors seem to have triggered your migraines in the past, avoid them. A headache diary might help you identify triggers.  Include physical activity in your daily routine. Try a daily walk or other moderate aerobic exercise.  Manage stress. Find healthy ways  to cope with the stressors, such as delegating tasks on your to-do list.  Practice relaxation techniques. Try deep breathing, yoga, massage and visualization.  Eat regularly. Eating regularly scheduled meals and maintaining a healthy diet might help prevent headaches. Also, drink plenty of fluids.  Follow a regular sleep schedule. Sleep deprivation might contribute to headaches Consider biofeedback. With this mind-body technique, you learn to control certain bodily functions -- such as muscle tension, heart rate and blood pressure -- to prevent headaches or reduce headache pain.    Proceed to emergency room if you experience new or worsening symptoms or symptoms do not resolve, if you have new neurologic symptoms or if headache is severe, or for any concerning symptom.   Provided education and documentation from American headache Society toolbox including articles on: pseudotumoer cerebri(IIH), chronic migraine medication overuse headache, chronic migraines, prevention of migraines and other headaches, behavioral and other nonpharmacologic treatments for headache.  Orders Placed This Encounter  Procedures   CT VENOGRAM HEAD   Ambulatory referral to [facility]     Meds ordered this encounter  Medications   DISCONTD: acetaZOLAMIDE (DIAMOX) 250 MG tablet    Sig: Take 2 tablets (500 mg total) by mouth 2 (two) times daily.    Dispense:  180 tablet    Refill:  4    Must be seen for further refills. Two week supply for now.   acetaZOLAMIDE ER (DIAMOX) 500 MG capsule    Sig: Take 1 capsule (500 mg total) by mouth 2 (two) times daily.    Dispense:  180 capsule    Refill:  4    Cancel prior diamox prescriptions    Cc: Dr. Geanie Kenning, MD  [facility] [address] [place], [place] [address]  Phone [phone] Fax [phone]

## 2023-04-19 NOTE — Patient Instructions (Signed)
CT veins Stay on diamox Healthy weight and wellness Idiopathic Intracranial Hypertension  Idiopathic intracranial hypertension (IIH) is a condition that increases pressure around the brain. The fluid that surrounds the brain and spinal cord (cerebrospinal fluid, or CSF) increases and causes the pressure. Idiopathic means that the cause of this condition is not known. IIH affects the brain and spinal cord. If this condition is not treated, it can cause vision loss or blindness. What are the causes? The cause of this condition is not known. What increases the risk? The following factors may make you more likely to develop this condition: Being obese. Being a person who is female, between the ages of 8 and 37 years old, and who has not gone through menopause. Taking certain medicines, such as birth control, acne medicines, or steroids. What are the signs or symptoms? Symptoms of this condition include: Headaches. This is the most common symptom. Brief periods of total blindness. Double vision, blurred vision, or poor side (peripheral) vision. Pain in the shoulders or neck. Nausea and vomiting. A sound like rushing water or a pulsing sound within the ears (pulsatile tinnitus), or ringing in the ears. How is this diagnosed? This condition may be diagnosed based on: Your symptoms and medical history. Imaging tests of the brain, such as: CT scan. MRI. Magnetic resonance venogram (MRV) to check the veins. Diagnostic lumbar puncture. This is a procedure to remove and examine a sample of CSF. This procedure can determine whether your fluid pressure is too high. An eye exam to check for swelling or nerve damage in the eyes. How is this treated? Treatment for this condition depends on the symptoms. The goal of treatment is to decrease the pressure around your brain. Common treatments include: Weight loss through healthy eating, salt restriction, and exercise, if you are overweight. Medicines  to decrease the production of CSF and lower the pressure within your skull. Medicines to prevent or treat headaches. Other treatments may include: Surgery to place drains (shunts) in your brain to remove extra fluid. Lumbar puncture to remove extra CSF. Follow these instructions at home: If you are overweight or obese, work with your health care provider to lose weight. Take over-the-counter and prescription medicines only as told by your health care provider. Ask your health care provider if the medicine prescribed to you requires you to avoid driving or using machinery. Do not use any products that contain nicotine or tobacco. These products include cigarettes, chewing tobacco, and vaping devices, such as e-cigarettes. If you need help quitting, ask your health care provider. Keep all follow-up visits. Your health care provider will need to monitor you regularly. Contact a health care provider if: You have changes in your vision, such as: Double vision. Blurred vision. Poor peripheral vision. Get help right away if: You have any of the following symptoms and they get worse or do not get better: Headaches. Nausea. Vomiting. Sudden trouble seeing. This information is not intended to replace advice given to you by your health care provider. Make sure you discuss any questions you have with your health care provider. Document Revised: 03/22/2022 Document Reviewed: 03/01/2022 Elsevier Patient Education  2024 [employer].

## 2023-04-20 ENCOUNTER — Telehealth: Payer: Self-pay | Admitting: Neurology

## 2023-04-20 NOTE — Telephone Encounter (Signed)
Joanne Summers: [id number] exp. 04/20/23-05/19/23 sent to GI [phone]

## 2023-05-16 DIAGNOSIS — E669 Obesity, unspecified: Secondary | ICD-10-CM | POA: Diagnosis not present

## 2023-05-16 DIAGNOSIS — E7800 Pure hypercholesterolemia, unspecified: Secondary | ICD-10-CM | POA: Diagnosis not present

## 2023-05-16 DIAGNOSIS — G932 Benign intracranial hypertension: Secondary | ICD-10-CM | POA: Diagnosis not present

## 2023-05-16 DIAGNOSIS — Z91018 Allergy to other foods: Secondary | ICD-10-CM | POA: Diagnosis not present

## 2023-05-29 NOTE — Progress Notes (Unsigned)
New Patient Note  RE: Joanne Summers MRN: [id number] DOB: 09-28-1986 Date of Office Visit: 05/30/2023  Consult requested by: Ollen Bowl, MD Primary care provider: Ollen Bowl, MD  Chief Complaint: No chief complaint on file.  History of Present Illness: I had the pleasure of seeing Joanne Summers for initial evaluation at the [facility] on 05/29/2023. She is a 37 y.o. female, who is referred here by Ollen Bowl, MD for the evaluation of food allergy.  She reports food allergy to ***. The reaction occurred at the age of ***, after she ate *** amount of ***. Symptoms started within *** and was in the form of *** hives, swelling, wheezing, abdominal pain, diarrhea, vomiting. ***Denies any associated cofactors such as exertion, infection, NSAID use, or alcohol consumption. The symptoms lasted for ***. She was evaluated in ED and received ***. Since this episode, she does *** not report other accidental exposures to ***. She does *** not have access to epinephrine autoinjector and *** needed to use it.   Past work up includes: ***. Dietary History: patient has been eating other foods including ***milk, ***eggs, ***peanut, ***treenuts, ***sesame, ***shellfish, ***fish, ***soy, ***wheat, ***meats, ***fruits and ***vegetables.  She reports reading labels and avoiding *** in diet completely. She tolerates ***baked egg and baked milk products.   Assessment and Plan: Joanne Summers is a 37 y.o. female with: No problem-specific Assessment & Plan notes found for this encounter.  No follow-ups on file.  No orders of the defined types were placed in this encounter.  Lab Orders  No laboratory test(s) ordered today    Other allergy screening: Asthma: {Blank single:19197::"yes","no"} Rhino conjunctivitis: {Blank single:19197::"yes","no"} Food allergy: {Blank single:19197::"yes","no"} Medication allergy: {Blank single:19197::"yes","no"} Hymenoptera allergy: {Blank  single:19197::"yes","no"} Urticaria: {Blank single:19197::"yes","no"} Eczema:{Blank single:19197::"yes","no"} History of recurrent infections suggestive of immunodeficency: {Blank single:19197::"yes","no"}  Diagnostics: Spirometry:  Tracings reviewed. Her effort: {Blank single:19197::"Good reproducible efforts.","It was hard to get consistent efforts and there is a question as to whether this reflects a maximal maneuver.","Poor effort, data can not be interpreted."} FVC: ***L FEV1: ***L, ***% predicted FEV1/FVC ratio: ***% Interpretation: {Blank single:19197::"Spirometry consistent with mild obstructive disease","Spirometry consistent with moderate obstructive disease","Spirometry consistent with severe obstructive disease","Spirometry consistent with possible restrictive disease","Spirometry consistent with mixed obstructive and restrictive disease","Spirometry uninterpretable due to technique","Spirometry consistent with normal pattern","No overt abnormalities noted given today's efforts"}.  Please see scanned spirometry results for details.  Skin Testing: {Blank single:19197::"Select foods","Environmental allergy panel","Environmental allergy panel and select foods","Food allergy panel","None","Deferred due to recent antihistamines use"}. *** Results discussed with patient/family.   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 180 capsule 4   Cetirizine HCl (ZYRTEC PO) Take 10 mg by mouth daily as needed (for allergies).     MELATONIN PO Take by mouth as needed.     No current facility-administered medications for this visit.  Allergies: Allergies  Allergen Reactions   Cefdinir Other (See Comments)   Social History: Social History   Socioeconomic History   Marital status: Single    Spouse name: Not on file   Number of children: 0   Years of education: Not on file   Highest education level: Master's degree (e.g., MA, MS, MEng, MEd, MSW, MBA)  Occupational History   Not on file  Tobacco Use   Smoking status: Never   Smokeless tobacco: Never  Vaping Use   Vaping status: Never Used  Substance and Sexual Activity   Alcohol use: Not Currently    Comment: occassionally   Drug use: No   Sexual activity: Not Currently    Birth control/protection: None  Other Topics Concern   Not on file  Social History Narrative   Lives at home alone   Right handed   Caffeine: sometimes maybe once or twice a week   Social Determinants of Health   Financial Resource Strain: Not on file  Food Insecurity: Not on file  Transportation Needs: Not on file  Physical Activity: Not on file  Stress: Not on file  Social Connections: Not on file   Lives in a ***. Smoking: *** Occupation: ***  Environmental HistorySurveyor, minerals in the house: Copywriter, advertising in the family room: {Blank single:19197::"yes","no"} Carpet in the bedroom: {Blank single:19197::"yes","no"} Heating: {Blank single:19197::"electric","gas","heat pump"} Cooling: {Blank single:19197::"central","window","heat pump"} Pet: {Blank single:19197::"yes ***","no"}  Family History: Family History  Problem Relation Age of Onset   Cancer Maternal Grandfather    Migraines Neg Hx    Problem                               Relation Asthma                                   *** Eczema                                 *** Food allergy                          *** Allergic rhino conjunctivitis     ***  Review of Systems  Constitutional:  Negative for appetite change, chills, fever and unexpected weight change.  HENT:  Negative for congestion and rhinorrhea.   Eyes:  Negative for itching.  Respiratory:  Negative for cough, chest tightness, shortness of breath and wheezing.   Cardiovascular:  Negative for chest pain.  Gastrointestinal:  Negative for abdominal pain.  Genitourinary:  Negative for difficulty urinating.  Skin:  Negative for rash.  Neurological:  Negative for headaches.    Objective: There were no vitals taken for this visit. There is no height or weight on file to calculate BMI. Physical Exam Vitals and nursing note reviewed.  Constitutional:      Appearance: Normal appearance. She is well-developed.  HENT:     Head: Normocephalic and atraumatic.     Right Ear: Tympanic membrane and external ear normal.     Left Ear: Tympanic membrane and external ear normal.     Nose: Nose normal.     Mouth/Throat:     Mouth: Mucous membranes are moist.     Pharynx: Oropharynx is clear.  Eyes:  Conjunctiva/sclera: Conjunctivae normal.  Cardiovascular:     Rate and Rhythm: Normal rate and regular rhythm.     Heart sounds: Normal heart sounds. No murmur heard.    No friction rub. No gallop.  Pulmonary:     Effort: Pulmonary effort is normal.     Breath sounds: Normal breath sounds. No wheezing, rhonchi or rales.  Musculoskeletal:     Cervical back: Neck supple.  Skin:    General: Skin is warm.     Findings: No rash.  Neurological:     Mental Status: She is alert and oriented to person, place, and time.  Psychiatric:        Behavior: Behavior normal.    The plan was reviewed with the patient/family, and all questions/concerned were addressed.  It was my pleasure to see Shanice today and participate in her care. Please feel free to contact me with any questions or  concerns.  Sincerely,  Wyline Mood, DO Allergy & Immunology  [facility] office: [phone] [facility] office: [phone]

## 2023-05-30 ENCOUNTER — Ambulatory Visit: Payer: BC Managed Care – PPO | Admitting: Allergy

## 2023-05-30 ENCOUNTER — Encounter: Payer: Self-pay | Admitting: Allergy

## 2023-05-30 ENCOUNTER — Other Ambulatory Visit: Payer: Self-pay

## 2023-05-30 VITALS — BP 120/84 | HR 94 | Temp 98.0°F | Resp 16 | Ht 62.0 in | Wt 177.8 lb

## 2023-05-30 DIAGNOSIS — J3089 Other allergic rhinitis: Secondary | ICD-10-CM | POA: Diagnosis not present

## 2023-05-30 DIAGNOSIS — T781XXD Other adverse food reactions, not elsewhere classified, subsequent encounter: Secondary | ICD-10-CM | POA: Diagnosis not present

## 2023-05-30 DIAGNOSIS — L2089 Other atopic dermatitis: Secondary | ICD-10-CM

## 2023-05-30 NOTE — Patient Instructions (Addendum)
Today's skin testing showed: Negative to salmon and pineapple.  Results given.  Food You had a delayed reaction to salmon. Recommend to monitor symptoms after you eat salmon. For mild symptoms you can take over the counter antihistamines such as Benadryl and monitor symptoms closely. If symptoms worsen or if you have severe symptoms including breathing issues, throat closure, significant swelling, whole body hives, severe diarrhea and vomiting, lightheadedness then seek immediate medical care.  Regarding the pineapple - fresh pineapples contain an enzyme called bromelain that sometimes can irritate the oral mucosa. Canned pineapples usually don't have this. However, if you have issues after eating pineapple then recommend to avoid.   Environmental allergies Monitor symptoms. If worsening - recommend environmental panel allergy testing. Use over the counter antihistamines such as Zyrtec (cetirizine), Claritin (loratadine), Allegra (fexofenadine), or Xyzal (levocetirizine) daily as needed.   Skin See below for proper skin care.  Follow up if needed.   Skin care recommendations  Bath time: Always use lukewarm water. AVOID very hot or cold water. Keep bathing time to 5-10 minutes. Do NOT use bubble bath. Use a mild soap and use just enough to wash the dirty areas. Do NOT scrub skin vigorously.  After bathing, pat dry your skin with a towel. Do NOT rub or scrub the skin.  Moisturizers and prescriptions:  ALWAYS apply moisturizers immediately after bathing (within 3 minutes). This helps to lock-in moisture. Use the moisturizer several times a day over the whole body. Good summer moisturizers include: Aveeno, CeraVe, Cetaphil. Good winter moisturizers include: Aquaphor, Vaseline, Cerave, Cetaphil, Eucerin, Vanicream. When using moisturizers along with medications, the moisturizer should be applied about one hour after applying the medication to prevent diluting effect of the  medication or moisturize around where you applied the medications. When not using medications, the moisturizer can be continued twice daily as maintenance.  Laundry and clothing: Avoid laundry products with added color or perfumes. Use unscented hypo-allergenic laundry products such as Tide free, Cheer free & gentle, and All free and clear.  If the skin still seems dry or sensitive, you can try double-rinsing the clothes. Avoid tight or scratchy clothing such as wool. Do not use fabric softeners or dyer sheets.

## 2023-05-31 ENCOUNTER — Encounter: Payer: Self-pay | Admitting: Allergy

## 2023-05-31 DIAGNOSIS — J3089 Other allergic rhinitis: Secondary | ICD-10-CM | POA: Insufficient documentation

## 2023-05-31 DIAGNOSIS — T781XXD Other adverse food reactions, not elsewhere classified, subsequent encounter: Secondary | ICD-10-CM | POA: Insufficient documentation

## 2023-05-31 DIAGNOSIS — L2089 Other atopic dermatitis: Secondary | ICD-10-CM | POA: Insufficient documentation

## 2023-05-31 NOTE — Assessment & Plan Note (Signed)
Lip tingling more than 12 hours after eating salmon. She took benadryl the last time with salmon and didn't have this symptom. Tolerates shellfish, and other finned fish with no issues. No other symptoms. Canned pineapple caused similar symptoms. Today's skin testing showed: Negative to salmon and pineapple. Discussed with patient that lip tingling with over 12 hour delayed reaction is not typical of "food allergies".  Recommend to monitor symptoms after you eat salmon. For mild symptoms you can take over the counter antihistamines such as Benadryl and monitor symptoms closely. If symptoms worsen or if you have severe symptoms including breathing issues, throat closure, significant swelling, whole body hives, severe diarrhea and vomiting, lightheadedness then seek immediate medical care. Regarding the pineapple - fresh pineapples contain an enzyme called bromelain that sometimes can irritate the oral mucosa. Canned pineapples usually don't have this. However, if you have issues after eating pineapple then recommend to avoid.

## 2023-05-31 NOTE — Assessment & Plan Note (Signed)
Rhino conjunctivitis symptoms during pollen seasons. Takes zyrtec prn with good benefit. Monitor symptoms. If worsening - recommend environmental panel allergy testing, declined today. Use over the counter antihistamines such as Zyrtec (cetirizine), Claritin (loratadine), Allegra (fexofenadine), or Xyzal (levocetirizine) daily as needed.

## 2023-05-31 NOTE — Assessment & Plan Note (Signed)
?   See below for proper skin care. ?

## 2023-11-02 DIAGNOSIS — B9689 Other specified bacterial agents as the cause of diseases classified elsewhere: Secondary | ICD-10-CM | POA: Diagnosis not present

## 2023-11-02 DIAGNOSIS — G8929 Other chronic pain: Secondary | ICD-10-CM | POA: Diagnosis not present

## 2023-11-02 DIAGNOSIS — Z87891 Personal history of nicotine dependence: Secondary | ICD-10-CM | POA: Diagnosis not present

## 2023-11-02 DIAGNOSIS — Z79899 Other long term (current) drug therapy: Secondary | ICD-10-CM | POA: Diagnosis not present

## 2023-11-02 DIAGNOSIS — N760 Acute vaginitis: Secondary | ICD-10-CM | POA: Diagnosis not present

## 2023-11-02 DIAGNOSIS — M25552 Pain in left hip: Secondary | ICD-10-CM | POA: Diagnosis not present

## 2023-11-02 DIAGNOSIS — R1032 Left lower quadrant pain: Secondary | ICD-10-CM | POA: Diagnosis not present

## 2023-11-03 DIAGNOSIS — M25552 Pain in left hip: Secondary | ICD-10-CM | POA: Diagnosis not present

## 2023-11-06 ENCOUNTER — Ambulatory Visit: Payer: BC Managed Care – PPO | Admitting: Obstetrics and Gynecology

## 2023-11-06 ENCOUNTER — Encounter: Payer: Self-pay | Admitting: Obstetrics and Gynecology

## 2023-11-06 VITALS — BP 137/81 | HR 125 | Ht 62.0 in | Wt 177.5 lb

## 2023-11-06 DIAGNOSIS — Z01419 Encounter for gynecological examination (general) (routine) without abnormal findings: Secondary | ICD-10-CM

## 2023-11-06 DIAGNOSIS — Z1339 Encounter for screening examination for other mental health and behavioral disorders: Secondary | ICD-10-CM

## 2023-11-06 NOTE — Progress Notes (Signed)
Pt presents for GYN visit. Pt had complaints of groin pain, but went to ED due to pain being so severe on 12/26. Diagnosed with an infection and was given abx along with pain medication. Pain has much improved, but ED told pt to follow up about follicles that were seen. Declines STD testing since it was done at ED.

## 2023-11-06 NOTE — Progress Notes (Signed)
Subjective:     Joanne Summers is a 37 y.o. female P0 with LMP 10/21/23 and BMI 32 who is here for follow up on left groin pain. The patient reports intermittent left groin pain for the past year. She states her pain worsened significantly with her last period in December such that she could not sit and went to the ED. Patient states the pain is localized in her left groin. She is not sexually active. She reports monthly periods. She was diagnosed with BV in the ED and pelvic ultrasound did not reveal any significant findings. Patient reports her pain has since improved following treatment with Flagyl and NSAID.  Past Medical History:  Diagnosis Date   Headache    PONV (postoperative nausea and vomiting)    Past Surgical History:  Procedure Laterality Date   BREAST REDUCTION SURGERY     BREAST SURGERY     LYMPH NODE DISSECTION     Family History  Problem Relation Age of Onset   Cancer Maternal Grandfather    Migraines Neg Hx     Social History   Socioeconomic History   Marital status: Single    Spouse name: Not on file   Number of children: 0   Years of education: Not on file   Highest education level: Master's degree (e.g., MA, MS, MEng, MEd, MSW, MBA)  Occupational History   Not on file  Tobacco Use   Smoking status: Never   Smokeless tobacco: Never  Vaping Use   Vaping status: Never Used  Substance and Sexual Activity   Alcohol use: Not Currently    Comment: occassionally   Drug use: No   Sexual activity: Not Currently    Birth control/protection: None  Other Topics Concern   Not on file  Social History Narrative   Lives at home alone   Right handed   Caffeine: sometimes maybe once or twice a week   Social Drivers of Corporate investment banker Strain: Not on file  Food Insecurity: Not on file  Transportation Needs: Not on file  Physical Activity: Not on file  Stress: Not on file  Social Connections: Not on file  Intimate Partner Violence: Not on file    Health Maintenance  Topic Date Due   DTaP/Tdap/Td (1 - Tdap) Never done   INFLUENZA VACCINE  Never done   COVID-19 Vaccine (1 - 2024-25 season) Never done   Cervical Cancer Screening (HPV/Pap Cotest)  08/30/2027   Hepatitis C Screening  Completed   HIV Screening  Completed   HPV VACCINES  Aged Out       Review of Systems Pertinent items noted in HPI and remainder of comprehensive ROS otherwise negative.   Objective:  Blood pressure 137/81, pulse (!) 125, height 5\' 2"  (1.575 m), weight 177 lb 8 oz (80.5 kg), last menstrual period 10/21/2023.   GENERAL: Well-developed, well-nourished female in no acute distress.  LUNGS: Clear to auscultation bilaterally.  HEART: Regular rate and rhythm. ABDOMEN: Soft, nontender, nondistended. No organomegaly. No evidence of inguinal hernia on exam PELVIC: Not performed EXTREMITIES: No cyanosis, clubbing, or edema, 2+ distal pulses.     Assessment:    Healthy female exam.      Plan:    Advised patient to follow up with PCP for hip evaluation Patient with normal pap smear 08/2022 Screening mammogram due 10/2024 RTC in 6 months or prn for annual exam See After Visit Summary for Counseling Recommendations

## 2023-11-17 DIAGNOSIS — E7800 Pure hypercholesterolemia, unspecified: Secondary | ICD-10-CM | POA: Diagnosis not present

## 2023-11-17 DIAGNOSIS — Z0000 Encounter for general adult medical examination without abnormal findings: Secondary | ICD-10-CM | POA: Diagnosis not present

## 2023-11-17 DIAGNOSIS — D649 Anemia, unspecified: Secondary | ICD-10-CM | POA: Diagnosis not present

## 2023-12-07 ENCOUNTER — Encounter (INDEPENDENT_AMBULATORY_CARE_PROVIDER_SITE_OTHER): Payer: Self-pay

## 2023-12-21 DIAGNOSIS — M549 Dorsalgia, unspecified: Secondary | ICD-10-CM | POA: Diagnosis not present

## 2023-12-25 ENCOUNTER — Encounter (INDEPENDENT_AMBULATORY_CARE_PROVIDER_SITE_OTHER): Payer: Self-pay

## 2024-01-01 DIAGNOSIS — M546 Pain in thoracic spine: Secondary | ICD-10-CM | POA: Diagnosis not present

## 2024-01-01 DIAGNOSIS — R293 Abnormal posture: Secondary | ICD-10-CM | POA: Diagnosis not present

## 2024-01-01 DIAGNOSIS — M2569 Stiffness of other specified joint, not elsewhere classified: Secondary | ICD-10-CM | POA: Diagnosis not present

## 2024-01-08 DIAGNOSIS — M2569 Stiffness of other specified joint, not elsewhere classified: Secondary | ICD-10-CM | POA: Diagnosis not present

## 2024-01-08 DIAGNOSIS — M546 Pain in thoracic spine: Secondary | ICD-10-CM | POA: Diagnosis not present

## 2024-01-08 DIAGNOSIS — R293 Abnormal posture: Secondary | ICD-10-CM | POA: Diagnosis not present

## 2024-01-15 DIAGNOSIS — M2569 Stiffness of other specified joint, not elsewhere classified: Secondary | ICD-10-CM | POA: Diagnosis not present

## 2024-01-15 DIAGNOSIS — M546 Pain in thoracic spine: Secondary | ICD-10-CM | POA: Diagnosis not present

## 2024-01-15 DIAGNOSIS — R293 Abnormal posture: Secondary | ICD-10-CM | POA: Diagnosis not present

## 2024-03-07 DIAGNOSIS — R0981 Nasal congestion: Secondary | ICD-10-CM | POA: Diagnosis not present

## 2024-03-12 ENCOUNTER — Emergency Department (HOSPITAL_COMMUNITY)
Admission: EM | Admit: 2024-03-12 | Discharge: 2024-03-13 | Disposition: A | Discharge status: Home / Self Care | Attending: Emergency Medicine | Admitting: Emergency Medicine

## 2024-03-12 ENCOUNTER — Other Ambulatory Visit: Payer: Self-pay

## 2024-03-12 ENCOUNTER — Encounter (HOSPITAL_COMMUNITY): Payer: Self-pay

## 2024-03-12 DIAGNOSIS — H9313 Tinnitus, bilateral: Secondary | ICD-10-CM | POA: Diagnosis not present

## 2024-03-12 NOTE — ED Triage Notes (Signed)
 Sinus pressure on both sides of face and ringing in both ears, this has been ongoing since Easter. Pt saw PCP, prescribed prednisone. Has had no relief.

## 2024-03-13 ENCOUNTER — Encounter (INDEPENDENT_AMBULATORY_CARE_PROVIDER_SITE_OTHER): Payer: Self-pay | Admitting: Otolaryngology

## 2024-03-13 ENCOUNTER — Ambulatory Visit (INDEPENDENT_AMBULATORY_CARE_PROVIDER_SITE_OTHER): Admitting: Physician Assistant

## 2024-03-13 ENCOUNTER — Encounter: Payer: Self-pay | Admitting: Neurology

## 2024-03-13 ENCOUNTER — Telehealth: Payer: Self-pay | Admitting: Neurology

## 2024-03-13 VITALS — BP 119/78 | HR 107 | Ht 62.0 in | Wt 180.0 lb

## 2024-03-13 DIAGNOSIS — H9313 Tinnitus, bilateral: Secondary | ICD-10-CM

## 2024-03-13 MED ORDER — CETIRIZINE HCL 10 MG PO TABS: 10.0000 mg | ORAL_TABLET | Freq: Every day | ORAL | 11 refills | Status: AC

## 2024-03-13 MED ORDER — FLUTICASONE PROPIONATE 50 MCG/ACT NA SUSP: 2.0000 | Freq: Every day | NASAL | 6 refills | Status: AC

## 2024-03-13 NOTE — Progress Notes (Unsigned)
 Dear Dr. Lilyan Remedies, Here is my assessment for our mutual patient, Joanne Summers. Thank you for allowing me the opportunity to care for your patient. Please do not hesitate to contact me should you have any other questions. Sincerely, Belma Boxer PA-C  Otolaryngology Clinic Note Referring provider: Dr. Lilyan Remedies HPI:  Joanne Summers is a 38 y.o. female kindly referred by Dr. Lilyan Remedies   The patient is a 38 year old female seen in our office for evaluation of tinnitus.  The patient notes that on May 1 approximately 1 week ago she had upper respiratory symptoms including runny nose and right sided ear pain.  She notes the next day she developed some left-sided ear fullness as well as dizziness.  She notes the addition of bilateral high-pitched tonal ringing that is consistent throughout the day.  She notes she followed up with her primary care provider who noted inflammation of the right nostril, she was placed on prednisone.  She notes that she has had pressure in her ears previously that felt like this send use Sudafed, but she notes after using Sudafed she did not have significant improvement in symptoms.  She denies any associated dizziness today, no decreased hearing, she denies any neurologic deficits.  She does report a history of benign intracranial hypertension diagnosed in 2019 that was causing headaches.  She notes that she has not had any recent symptoms of this.  Nuys any significant noise exposure, no head or neck trauma, no head or neck surgeries.     Independent Review of Additional Tests or Records:  Audiological evaluation on 03/13/2024  Normal audiological evaluation with bilateral type *** tympanometry ***  PMH/Meds/All/SocHx/FamHx/ROS:   Past Medical History:  Diagnosis Date   Headache    PONV (postoperative nausea and vomiting)      Past Surgical History:  Procedure Laterality Date   BREAST REDUCTION SURGERY     BREAST SURGERY     LYMPH NODE DISSECTION      Family  History  Problem Relation Age of Onset   Cancer Maternal Grandfather    Migraines Neg Hx      Social Connections: Not on file      Current Outpatient Medications:    ferrous sulfate 325 (65 FE) MG EC tablet, Take 325 mg by mouth 3 (three) times daily with meals., Disp: , Rfl:    MELATONIN PO, Take by mouth as needed., Disp: , Rfl:    Multiple Vitamin (MULTIVITAMIN WITH MINERALS) TABS tablet, Take 1 tablet by mouth daily., Disp: , Rfl:    acetaZOLAMIDE  ER (DIAMOX ) 500 MG capsule, Take 1 capsule (500 mg total) by mouth 2 (two) times daily. (Patient not taking: Reported on 03/13/2024), Disp: 180 capsule, Rfl: 4   Cetirizine HCl (ZYRTEC PO), Take 10 mg by mouth daily as needed (for allergies). (Patient not taking: Reported on 05/30/2023), Disp: , Rfl:    Physical Exam:   BP 119/78   Pulse (!) 107   Ht 5\' 2"  (1.575 m)   Wt 180 lb (81.6 kg)   SpO2 97%   BMI 32.92 kg/m   Pertinent Findings  CN II-XII intact Bilateral EAC clear and TM intact with well pneumatized middle ear spaces Weber 512: equal Rinne 512: AC > BC b/l  Anterior rhinoscopy: Septum ***; bilateral inferior turbinates with *** No lesions of oral cavity/oropharynx; dentition *** No obviously palpable neck masses/lymphadenopathy/thyromegaly No respiratory distress or stridor  Seprately Identifiable Procedures:  None***  Impression & Plans:  Joanne Summers is a 38 y.o. female with  the following   ***   - f/u ***  Floanse, zyrtex, nasal    Thank you for allowing me the opportunity to care for your patient. Please do not hesitate to contact me should you have any other questions.  Sincerely, Belma Boxer PA-C [facility] Phone: [phone] Fax: [phone]  03/13/2024, 4:11 PM

## 2024-03-13 NOTE — Discharge Instructions (Signed)
 Your evaluation today did not show any sign of an ear infection.  Please follow-up with the ear specialist for further evaluation of your tinnitus.

## 2024-03-13 NOTE — ED Provider Notes (Signed)
 [facility] Provider Note   CSN: [id number] Arrival date & time: 03/12/24  2212     History  Chief Complaint  Patient presents with   Tinnitus   Facial Pain    Joanne Summers is a 38 y.o. female.  The history is provided by the patient.  She complains of pressure in both ears and ringing in both ears which started about 2 weeks ago and has been getting worse.  It started with her right ear.  She did see her primary care provider who noted some inflammation in her nose and gave her prescription for prednisone which did not help.  She also had been taking pseudoephedrine which also has not been giving her any relief.  Symptoms have been getting progressively worse.  Tinnitus is now constant.  She denies any facial pain other than some slight aching in the preauricular area.  She denies any fever or chills.  She denies any aspirin ingestion.   Home Medications Prior to Admission medications   Medication Sig Start Date End Date Taking? Authorizing Provider  acetaZOLAMIDE  ER (DIAMOX ) 500 MG capsule Take 1 capsule (500 mg total) by mouth 2 (two) times daily. Patient not taking: Reported on 11/06/2023 04/19/23   Glory Larsen, MD  Cetirizine HCl (ZYRTEC PO) Take 10 mg by mouth daily as needed (for allergies). Patient not taking: Reported on 05/30/2023    [provider]  ferrous sulfate 325 (65 FE) MG EC tablet Take 325 mg by mouth 3 (three) times daily with meals.    [provider]  MELATONIN PO Take by mouth as needed.    [provider]  Multiple Vitamin (MULTIVITAMIN WITH MINERALS) TABS tablet Take 1 tablet by mouth daily.    [provider]      Allergies    Cefdinir    Review of Systems   Review of Systems  All other systems reviewed and are negative.   Physical Exam Updated Vital Signs BP 99/71   Pulse 93   Temp 98.2 F (36.8 C) (Oral)   Resp 18   Ht 5\' 2"  (1.575 m)   Wt 80.5 kg    SpO2 100%   BMI 32.46 kg/m  Physical Exam Vitals and nursing note reviewed.   38 year old female, resting comfortably and in no acute distress. Vital signs are normal. Oxygen saturation is 100%, which is normal. Head is normocephalic and atraumatic. PERRLA, EOMI. Tympanic membranes are clear.  Examination of his nasal cavity shows normal turbinates.  There are no preauricular or postauricular lymph nodes palpable.  There is no pain when tension is applied to the helix, no tenderness to palpation in the face or preauricular or postauricular areas. Neck is nontender and supple without adenopathys. Lungs are clear without rales, wheezes, or rhonchi. Chest is nontender. Heart has regular rate and rhythm without murmur. Skin is warm and dry without rash. Neurologic: Mental status is normal, moves all extremities equally.  ED Results / Procedures / Treatments    Procedures Procedures    Medications Ordered in ED Medications - No data to display  ED Course/ Medical Decision Making/ A&P                                 Medical Decision Making  Tinnitus without obvious cause, normal exam.  No indication for antibiotics.  I am referring her to ENT for  further workup and treatment.  Final Clinical Impression(s) / ED Diagnoses Final diagnoses:  Tinnitus of both ears    Rx / DC Orders ED Discharge Orders     None         Alissa April, MD 03/13/24 [phone]

## 2024-03-13 NOTE — Telephone Encounter (Signed)
 A request to be put on wait list

## 2024-03-14 NOTE — Progress Notes (Signed)
 Chief Complaint  Patient presents with   Follow-up    Pt in room 2. Here for IIH follow up. Pt stopped Diamox  6 months ago, not able to take with iron supplement causes SOB, pt said she taken diamox  and iron at different times and still causes SOB. No side effects when stopping diamox .    HISTORY OF PRESENT ILLNESS:  03/18/24 ALL:  Joanne Summers is a 38 y.o. female here today for follow up for IIH. LP with opening pressure of 30 04/2018. She was last seen by Dr Tresia Fruit 04/2023 and doing well on acetazolamide  that had recently been restarted after ER visit for facial numbness. She called 03/13/24 reporting another ER visit for tinnitus and dizziness. She was referred to ENT but wished to see us  sooner. She reported not tolerating acetazolamide  and had discontinued. She reports SHOB when taking Diamox  and iron together. Symptoms stopped when discontinuing Diamox . ENT visit last week unremarkable. She was started on Flonase  and Zyrtec .  Today, she reports significant improvement in dizziness and tinnitus. She has follow up with ENT tomorrow. She reports headaches have been fairly stable. She did have some pressure with previous allergy  symptoms but denies headaches. No vision changes. Prior to this episode, she was feeling well. No headaches. Last eye exam was unremarkable 03/2023. She has follow up with ophthalmology next week. She would like referral to weight management. She tries to stay active and well hydrated.    HISTORY (copied from Dr Harding Li previous note)  04/19/2023: Patient here for IDIOPATHIC INTRACRANIAL HYPERTENSION, we have not seen her since 2021. She is back on diamox  and feeling well, went to the emergency room. I reviewed notes, she presented to the ED not on diamox  with facial numbness and saw Dr. Cleone Dad. MRI was negative. She saw an eye doctor just recently. Restarted on diamox  andd feeling better, no more facial numbness. Started in the lips and tongues and traveled up the  face,Dr. Fara Hone O.D. about a month ago. Primary started you on diamox , lost 20 pounds but gained it back. No headaches or vision changes. No other focal neurologic deficits, associated symptoms, inciting events or modifiable factors.    REVIEW OF SYSTEMS: Out of a complete 14 system review of symptoms, the patient complains only of the following symptoms, tinnitus, dizziness, ear pressure and all other reviewed systems are negative.   ALLERGIES: Allergies  Allergen Reactions   Cefdinir Other (See Comments)     HOME MEDICATIONS: Outpatient Medications Prior to Visit  Medication Sig Dispense Refill   cetirizine  (ZYRTEC ) 10 MG tablet Take 1 tablet (10 mg total) by mouth daily. 30 tablet 11   ferrous sulfate 325 (65 FE) MG EC tablet Take 325 mg by mouth 3 (three) times daily with meals.     fluticasone  (FLONASE ) 50 MCG/ACT nasal spray Place 2 sprays into both nostrils daily. 16 g 6   Multiple Vitamin (MULTIVITAMIN WITH MINERALS) TABS tablet Take 1 tablet by mouth daily.     acetaZOLAMIDE  ER (DIAMOX ) 500 MG capsule Take 1 capsule (500 mg total) by mouth 2 (two) times daily. (Patient not taking: Reported on 11/06/2023) 180 capsule 4   MELATONIN PO Take by mouth as needed. (Patient not taking: Reported on 03/18/2024)     No facility-administered medications prior to visit.     PAST MEDICAL HISTORY: Past Medical History:  Diagnosis Date   Headache    PONV (postoperative nausea and vomiting)      PAST SURGICAL HISTORY: Past  Surgical History:  Procedure Laterality Date   BREAST REDUCTION SURGERY     BREAST SURGERY     LYMPH NODE DISSECTION       FAMILY HISTORY: Family History  Problem Relation Age of Onset   Cancer Maternal Grandfather    Migraines Neg Hx      SOCIAL HISTORY: Social History   Socioeconomic History   Marital status: Single    Spouse name: Not on file   Number of children: 0   Years of education: Not on file   Highest education level: Master's  degree (e.g., MA, MS, MEng, MEd, MSW, MBA)  Occupational History   Not on file  Tobacco Use   Smoking status: Never   Smokeless tobacco: Never  Vaping Use   Vaping status: Never Used  Substance and Sexual Activity   Alcohol use: Not Currently    Comment: occassionally   Drug use: No   Sexual activity: Not Currently    Birth control/protection: None  Other Topics Concern   Not on file  Social History Narrative   Lives at home alone   Right handed   Caffeine: sometimes maybe once or twice a week   Social Drivers of Corporate investment banker Strain: Not on file  Food Insecurity: Not on file  Transportation Needs: Not on file  Physical Activity: Not on file  Stress: Not on file  Social Connections: Not on file  Intimate Partner Violence: Not on file     PHYSICAL EXAM  Vitals:   03/18/24 0816  BP: 122/78  Pulse: (!) 102  SpO2: 99%  Weight: 183 lb 8 oz (83.2 kg)  Height: 5\' 2"  (1.575 m)   Body mass index is 33.56 kg/m.  Generalized: Well developed, in no acute distress  Cardiology: normal rate and rhythm, no murmur auscultated  Respiratory: clear to auscultation bilaterally    Neurological examination  Mentation: Alert oriented to time, place, history taking. Follows all commands speech and language fluent Cranial nerve II-XII: Pupils were equal round reactive to light. Extraocular movements were full, visual field were full on confrontational test. Facial sensation and strength were normal. Uvula tongue midline. Head turning and shoulder shrug  were normal and symmetric. Motor: The motor testing reveals 5 over 5 strength of all 4 extremities. Good symmetric motor tone is noted throughout.  Gait and station: Gait is normal.   DIAGNOSTIC DATA (LABS, IMAGING, TESTING) - I reviewed patient records, labs, notes, testing and imaging myself where available.  Lab Results  Component Value Date   WBC 8.2 01/10/2023   HGB 13.3 01/10/2023   HCT 39.0 01/10/2023   MCV  96.7 01/10/2023   PLT 380 01/10/2023      Component Value Date/Time   NA 138 01/10/2023 1135   NA 140 04/05/2018 1528   K 4.3 01/10/2023 1135   CL 104 01/10/2023 1135   CO2 22 01/10/2023 1129   GLUCOSE 100 (H) 01/10/2023 1135   BUN 11 01/10/2023 1135   BUN 10 04/05/2018 1528   CREATININE 0.80 01/10/2023 1135   CALCIUM 8.6 (L) 01/10/2023 1129   PROT 7.6 01/10/2023 1129   PROT 7.1 04/05/2018 1528   ALBUMIN 4.0 01/10/2023 1129   ALBUMIN 4.0 04/05/2018 1528   AST 25 01/10/2023 1129   ALT 28 01/10/2023 1129   ALKPHOS 46 01/10/2023 1129   BILITOT 0.4 01/10/2023 1129   BILITOT 0.3 04/05/2018 1528   GFRNONAA >60 01/10/2023 1129   GFRAA 101 04/05/2018 1528   No results  found for: "CHOL", "HDL", "LDLCALC", "LDLDIRECT", "TRIG", "CHOLHDL" No results found for: "HGBA1C" No results found for: "VITAMINB12" Lab Results  Component Value Date   TSH 2.001 01/10/2023        No data to display               No data to display           ASSESSMENT AND PLAN  38 y.o. year old female  has a past medical history of Headache and PONV (postoperative nausea and vomiting). here with    IIH (idiopathic intracranial hypertension) - Plan: Ambulatory referral to Family Practice  Obesity (BMI 30.0-34.9)  Joanne Summers reports symptoms have significantly improved after starting Flonase  and Zyrtec . We will monitor closely. She ahs follow up with ENT and ophthalmology in the upcoming weeks. We have discussed trial of topiramate, if needed. Education provided in AVS. Referral placed to Healthy Weight and Wellness. Healthy lifestyle habits encouraged. She will follow up with PCP as directed. She will return to see me in 6 months, sooner if needed. She verbalizes understanding and agreement with this plan.   Orders Placed This Encounter  Procedures   Ambulatory referral to Family Practice    Referral Priority:   Routine    Referral Type:   Consultation    Referral Reason:   Specialty  Services Required    Referred to Provider:   Glenora Laos, MD    Requested Specialty:   [facility]    Number of Visits Requested:   1     No orders of the defined types were placed in this encounter.   I spent 30 minutes of face-to-face and non-face-to-face time with patient.  This included previsit chart review, lab review, study review, order entry, electronic health record documentation, patient education.   Terrilyn Fick, MSN, FNP-C 03/18/2024, 8:54 AM  [facility] [address], [address] [place], [place] [address] [phone]

## 2024-03-18 ENCOUNTER — Encounter: Payer: Self-pay | Admitting: Family Medicine

## 2024-03-18 ENCOUNTER — Encounter (INDEPENDENT_AMBULATORY_CARE_PROVIDER_SITE_OTHER): Payer: Self-pay

## 2024-03-18 ENCOUNTER — Ambulatory Visit: Admitting: Family Medicine

## 2024-03-18 VITALS — BP 122/78 | HR 102 | Ht 62.0 in | Wt 183.5 lb

## 2024-03-18 DIAGNOSIS — E66811 Obesity, class 1: Secondary | ICD-10-CM | POA: Diagnosis not present

## 2024-03-18 DIAGNOSIS — G932 Benign intracranial hypertension: Secondary | ICD-10-CM

## 2024-03-18 NOTE — Patient Instructions (Signed)
 Below is our plan:  We will continue to monitor for now. Follow up with ENT and ophthalmology. We can consider topiramate if you wish. I have referred you to [facility]   Please make sure you are staying well hydrated. I recommend 50-60 ounces daily. Well balanced diet and regular exercise encouraged. Consistent sleep schedule with 6-8 hours recommended.   Please continue follow up with care team as directed.   Follow up with me in 6 months   You may receive a survey regarding today's visit. I encourage you to leave honest feed back as I do use this information to improve patient care. Thank you for seeing me today!   GENERAL HEADACHE INFORMATION:   Natural supplements: Magnesium Oxide or Magnesium Glycinate 500 mg at bed (up to 800 mg daily) Coenzyme Q10 300 mg in AM Vitamin B2- 200 mg twice a day   Add 1 supplement at a time since even natural supplements can have undesirable side effects. You can sometimes buy supplements cheaper (especially Coenzyme Q10) at www.WebmailGuide.co.za or at [facility].  Migraine with aura: There is increased risk for stroke in women with migraine with aura and a contraindication for the combined contraceptive pill for use by women who have migraine with aura. The risk for women with migraine without aura is lower. However other risk factors like smoking are far more likely to increase stroke risk than migraine. There is a recommendation for no smoking and for the use of OCPs without estrogen such as progestogen only pills particularly for women with migraine with aura.Aaron Aas People who have migraine headaches with auras may be 3 times more likely to have a stroke caused by a blood clot, compared to migraine patients who don't see auras. Women who take hormone-replacement therapy may be 30 percent more likely to suffer a clot-based stroke than women not taking medication containing estrogen. Other risk factors like smoking and high blood pressure may be  much  more important.    Vitamins and herbs that show potential:   Magnesium: Magnesium (250 mg twice a day or 500 mg at bed) has a relaxant effect on smooth muscles such as blood vessels. Individuals suffering from frequent or daily headache usually have low magnesium levels which can be increase with daily supplementation of 400-750 mg. Three trials found 40-90% average headache reduction  when used as a preventative. Magnesium may help with headaches are aura, the best evidence for magnesium is for migraine with aura is its thought to stop the cortical spreading depression we believe is the pathophysiology of migraine aura.Magnesium also demonstrated the benefit in menstrually related migraine.  Magnesium is part of the messenger system in the serotonin cascade and it is a good muscle relaxant.  It is also useful for constipation which can be a side effect of other medications used to treat migraine. Good sources include nuts, whole grains, and tomatoes. Side Effects: loose stool/diarrhea  Riboflavin (vitamin B 2) 200 mg twice a day. This vitamin assists nerve cells in the production of ATP a principal energy storing molecule.  It is necessary for many chemical reactions in the body.  There have been at least 3 clinical trials of riboflavin using 400 mg per day all of which suggested that migraine frequency can be decreased.  All 3 trials showed significant improvement in over half of migraine sufferers.  The supplement is found in bread, cereal, milk, meat, and poultry.  Most Americans get more riboflavin than the recommended daily allowance, however  riboflavin deficiency is not necessary for the supplements to help prevent headache. Side effects: energizing, green urine   Coenzyme Q10: This is present in almost all cells in the body and is critical component for the conversion of energy.  Recent studies have shown that a nutritional supplement of CoQ10 can reduce the frequency of migraine attacks by improving  the energy production of cells as with riboflavin.  Doses of 150 mg twice a day have been shown to be effective.   Melatonin: Increasing evidence shows correlation between melatonin secretion and headache conditions.  Melatonin supplementation has decreased headache intensity and duration.  It is widely used as a sleep aid.  Sleep is natures way of dealing with migraine.  A dose of 3 mg is recommended to start for headaches including cluster headache. Higher doses up to 15 mg has been reviewed for use in Cluster headache and have been used. The rationale behind using melatonin for cluster is that many theories regarding the cause of Cluster headache center around the disruption of the normal circadian rhythm in the brain.  This helps restore the normal circadian rhythm.   HEADACHE DIET: Foods and beverages which may trigger migraine Note that only 20% of headache patients are food sensitive. You will know if you are food sensitive if you get a headache consistently 20 minutes to 2 hours after eating a certain food. Only cut out a food if it causes headaches, otherwise you might remove foods you enjoy! What matters most for diet is to eat a well balanced healthy diet full of vegetables and low fat protein, and to not miss meals.   Chocolate, other sweets ALL cheeses except cottage and cream cheese Dairy products, yogurt, sour cream, ice cream Liver Meat extracts (Bovril, Marmite, meat tenderizers) Meats or fish which have undergone aging, fermenting, pickling or smoking. These include: Hotdogs,salami,Lox,sausage, mortadellas,smoked salmon, pepperoni, Pickled herring Pods of broad bean (English beans, Chinese pea pods, [place] (fava) beans, lima and navy beans Ripe avocado, ripe banana Yeast extracts or active yeast preparations such as Brewer's or Fleishman's (commercial bakes goods are permitted) Tomato based foods, pizza (lasagna, etc.)   MSG (monosodium glutamate) is disguised as many things;  look for these common aliases: Monopotassium glutamate Autolysed yeast Hydrolysed protein Sodium caseinate "flavorings" "all natural preservatives" Nutrasweet   Avoid all other foods that convincingly provoke headaches.   Resources: The Dizzy Althia Jetty Your Headache Diet, migrainestrong.com  https://zamora-andrews.com/   Caffeine and Migraine For patients that have migraine, caffeine intake more than 3 days per week can lead to dependency and increased migraine frequency. I would recommend cutting back on your caffeine intake as best you can. The recommended amount of caffeine is 200-300 mg daily, although migraine patients may experience dependency at even lower doses. While you may notice an increase in headache temporarily, cutting back will be helpful for headaches in the long run. For more information on caffeine and migraine, visit: https://americanmigrainefoundation.org/resource-library/caffeine-and-migraine/   Headache Prevention Strategies:   1. Maintain a headache diary; learn to identify and avoid triggers.  - This can be a simple note where you log when you had a headache, associated symptoms, and medications used - There are several smartphone apps developed to help track migraines: Migraine Buddy, Migraine Monitor, Curelator N1-Headache App   Common triggers include: Emotional triggers: Emotional/Upset family or friends Emotional/Upset occupation Business reversal/success Anticipation anxiety Crisis-serious Post-crisis periodNew job/position   Physical triggers: Vacation Day Weekend Strenuous Exercise High Altitude Location New Move Menstrual Day Physical  Illness Oversleep/Not enough sleep Weather changes Light: Photophobia or light sesnitivity treatment involves a balance between desensitization and reduction in overly strong input. Use dark polarized glasses outside, but not inside. Avoid bright or fluorescent  light, but do not dim environment to the point that going into a normally lit room hurts. Consider FL-41 tint lenses, which reduce the most irritating wavelengths without blocking too much light.  These can be obtained at axonoptics.com or theraspecs.com Foods: see list above.   2. Limit use of acute treatments (over-the-counter medications, triptans, etc.) to no more than 2 days per week or 10 days per month to prevent medication overuse headache (rebound headache).     3. Follow a regular schedule (including weekends and holidays): Don't skip meals. Eat a balanced diet. 8 hours of sleep nightly. Minimize stress. Exercise 30 minutes per day. Being overweight is associated with a 5 times increased risk of chronic migraine. Keep well hydrated and drink 6-8 glasses of water per day.   4. Initiate non-pharmacologic measures at the earliest onset of your headache. Rest and quiet environment. Relax and reduce stress. Breathe2Relax is a free app that can instruct you on    some simple relaxtion and breathing techniques. Http://Dawnbuse.com is a    free website that provides teaching videos on relaxation.  Also, there are  many apps that   can be downloaded for "mindful" relaxation.  An app called YOGA NIDRA will help walk you through mindfulness. Another app called Calm can be downloaded to give you a structured mindfulness guide with daily reminders and skill development. Headspace for guided meditation Mindfulness Based Stress Reduction Online Course: www.palousemindfulness.com Cold compresses.   5. Don't wait!! Take the maximum allowable dosage of prescribed medication at the first sign of migraine.   6. Compliance:  Take prescribed medication regularly as directed and at the first sign of a migraine.   7. Communicate:  Call your physician when problems arise, especially if your headaches change, increase in frequency/severity, or become associated with neurological symptoms (weakness, numbness,  slurred speech, etc.). Proceed to emergency room if you experience new or worsening symptoms or symptoms do not resolve, if you have new neurologic symptoms or if headache is severe, or for any concerning symptom.   8. Headache/pain management therapies: Consider various complementary methods, including medication, behavioral therapy, psychological counselling, biofeedback, massage therapy, acupuncture, dry needling, and other modalities.  Such measures may reduce the need for medications. Counseling for pain management, where patients learn to function and ignore/minimize their pain, seems to work very well.   9. Recommend changing family's attention and focus away from patient's headaches. Instead, emphasize daily activities. If first question of day is 'How are your headaches/Do you have a headache today?', then patient will constantly think about headaches, thus making them worse. Goal is to re-direct attention away from headaches, toward daily activities and other distractions.   10. Helpful Websites: www.AmericanHeadacheSociety.org PatentHood.ch www.headaches.org TightMarket.nl www.achenet.org

## 2024-03-19 ENCOUNTER — Ambulatory Visit (INDEPENDENT_AMBULATORY_CARE_PROVIDER_SITE_OTHER): Admitting: Audiology

## 2024-03-19 DIAGNOSIS — H93291 Other abnormal auditory perceptions, right ear: Secondary | ICD-10-CM

## 2024-03-19 DIAGNOSIS — Z0110 Encounter for examination of ears and hearing without abnormal findings: Secondary | ICD-10-CM | POA: Diagnosis not present

## 2024-03-19 NOTE — Progress Notes (Addendum)
  [address], [address] [place], [place] [address] [phone]  Audiological Evaluation    Name: [name]     DOB:   05/30/1986      MRN:   [id number]                                                                                     Service Date: 03/19/2024     Accompanied by: unaccompanied    Patient comes today after Lorane Rocker, PA-C sent a referral for a hearing evaluation due to concerns with tinnitus.   Symptoms Yes Details  Hearing loss  []    Tinnitus  [x]  Both ears - sometimes just in the right ear- reports onset was about 2 weeks ago after she had a cold. Reports the changing positions helps the tinnitus go away/reduce.  Ear pain/ infections/pressure  [x]  This morning some right sided pain. No pressure in the ears today  Balance problems  []    Noise exposure history  []    Previous ear surgeries  []    Family history of hearing loss  []    Amplification  []    Other  []      Otoscopy: Right ear: Clear external ear canals and notable landmarks visualized on the tympanic membrane. Left ear:  Clear external ear canals and notable landmarks visualized on the tympanic membrane.  Tympanometry: Right ear: Type A- Normal external ear canal volume with normal middle ear pressure and tympanic membrane compliance. Left ear: Type A- Normal external ear canal volume with normal middle ear pressure and tympanic membrane compliance.    Pure tone Audiometry: Normal hearing from [phone], in both ears.   Speech Audiometry: Right ear- Speech Reception Threshold (SRT) was obtained at 5 dBHL. Left ear-Speech Reception Threshold (SRT) was obtained at 5 dBHL.   Word Recognition Score Tested using NU-6 (MLV) Right ear: 100% was obtained at a presentation level of 50 dBHL with contralateral masking which is deemed as  excellent. Left ear: 100% was obtained at a presentation level of 50 dBHL with contralateral masking which is deemed as  excellent.   The hearing test  results were completed under headphones and results are deemed to be of good reliability. Test technique:  conventional      Recommendations: Follow up with ENT as scheduled for today. Return for a hearing evaluation if concerns with hearing changes arise or per MD recommendation. Patient may use a white noise machine - to hep reduce tinnitus perception when exposed to loud sounds.   Mackena Plummer MARIE LEROUX-MARTINEZ, AUD

## 2024-03-25 ENCOUNTER — Encounter: Payer: Self-pay | Admitting: Audiology

## 2024-03-26 ENCOUNTER — Encounter (INDEPENDENT_AMBULATORY_CARE_PROVIDER_SITE_OTHER): Payer: Self-pay

## 2024-04-24 ENCOUNTER — Ambulatory Visit: Payer: BC Managed Care – PPO | Admitting: Adult Health

## 2024-05-21 NOTE — Progress Notes (Signed)
 Office: [phone]  /  Fax: [phone]   Initial Visit    Joanne Summers was seen in clinic today to evaluate for obesity. She is interested in losing weight to improve overall health and reduce the risk of weight related complications. She presents today to review program treatment options, initial physical assessment, and evaluation.     She was referred by: Specialist- Neurologist  When asked what else they would like to accomplish? She states: Adopt a healthier eating pattern and lifestyle, Improve energy levels and physical activity, Improve existing medical conditions, Improve quality of life, Improve appearance, Improve self-confidence, and Lose 20-30 lbs  When asked how has your weight affected you? She states: Has affected self-esteem, Relationships, Contributed to medical problems, Having fatigue, Having poor endurance, and Has affected mood   Weight history: Joanne Summers is a 38 year old female who presents for obesity management.  She has experienced weight gain since 2018, initially managing her weight through exercise despite not following a strict diet. Over the past year, her weight has increased continuously despite dietary adjustments. She identifies as a 'big sweet person' and struggles to avoid sweets even after healthy meals. She has tried various weight loss strategies, including low carb, ketogenic diets, and intermittent fasting, without significant success. She was referred by her neurologist who she sees for idiopathic intracranial hypertension.   Currently, she follows a low carb, high protein diet and engages in physical activities such as walking for an hour three times a week, jogging for short periods, and doing cardio exercises via YouTube videos. Her highest weight is her current weight, and she desires to lose 20 to 30 pounds. Her weight has impacted her self-esteem, particularly regarding clothing and social interactions.  She denies any history of  high blood pressure, arthritis, hyperlipidemia, sleep apnea, diabetes, gastroesophageal reflux, overactive bladder, polycystic ovary syndrome, heart disease, lung disease, kidney disease, vitamin D deficiency, connective tissue disease, or venous insufficiency. She experiences fatigue and poor endurance at times and acknowledges that her weight has affected her mood, causing anxiety and depression occasionally. No history of skipping meals or overeating as a compensatory behavior.  Her social history includes a sedentary job in [employer]. She describes a hectic lifestyle and acknowledges a need for convenience in her eating habits. She has not undergone metabolic surgery and has made multiple weight loss attempts in the past. Family history of obesity.  Highest weight: 182 lb  Some associated conditions: Hyperlipidemia and Other: intracranial hypertension  Contributing factors: family history of obesity, consumption of processed foods, reduced physical activity, strong orexigenic signaling and/or inadequate inhibitory control , slow metabolism for age, need for convenience due to lack of time, enticing relationships and enviroment, multiple weight loss attempts in the past, sedentary job, hectic pace of life, need for convenient foods, and self - critic or all-or-none mindset  Weight promoting medications identified: None  Prior weight loss attempts: Low Carb, Ketogenic, Tracking and Journaling, Meal Replacements, Balanced Plate / Portion Control, Intermittent fasting, and High Protein  Current nutrition plan: Low-carb and High-protein  Current level of physical activity: Walking 60 minutes, three a week, Running / Jogging 10 minutes, three, and Step counting 6000  Current or previous pharmacotherapy: None  Response to medication: Never tried medications   Past medical history includes:   Past Medical History:  Diagnosis Date   Headache    PONV (postoperative nausea and vomiting)       Objective    BP 113/73   Pulse  98   Temp 98.1 F (36.7 C)   Ht 5' 2 (1.575 m)   Wt 182 lb 9.6 oz (82.8 kg)   LMP 05/08/2024 (Approximate)   SpO2 98%   BMI 33.40 kg/m  She was weighed on the bioimpedance scale: Body mass index is 33.4 kg/m.  Body Fat%:36.5%, Visceral Fat Rating:8, Weight trend over the last 12 months: Increasing  General:  Alert, oriented and cooperative. Patient is in no acute distress.  Respiratory: Normal respiratory effort, no problems with respiration noted   Gait: able to ambulate independently  Mental Status: Normal mood and affect. Normal behavior. Normal judgment and thought content.   DIAGNOSTIC DATA REVIEWED:  BMET    Component Value Date/Time   NA 138 01/10/2023 1135   NA 140 04/05/2018 1528   K 4.3 01/10/2023 1135   CL 104 01/10/2023 1135   CO2 22 01/10/2023 1129   GLUCOSE 100 (H) 01/10/2023 1135   BUN 11 01/10/2023 1135   BUN 10 04/05/2018 1528   CREATININE 0.80 01/10/2023 1135   CALCIUM 8.6 (L) 01/10/2023 1129   GFRNONAA >60 01/10/2023 1129   GFRAA 101 04/05/2018 1528   No results found for: HGBA1C No results found for: INSULIN CBC    Component Value Date/Time   WBC 8.2 01/10/2023 1129   RBC 3.90 01/10/2023 1129   HGB 13.3 01/10/2023 1135   HGB 12.6 04/05/2018 1528   HCT 39.0 01/10/2023 1135   HCT 38.5 04/05/2018 1528   PLT 380 01/10/2023 1129   PLT 413 04/05/2018 1528   MCV 96.7 01/10/2023 1129   MCV 96 04/05/2018 1528   MCH 31.0 01/10/2023 1129   MCHC 32.1 01/10/2023 1129   RDW 12.1 01/10/2023 1129   RDW 13.0 04/05/2018 1528   Iron/TIBC/Ferritin/ %Sat No results found for: IRON, TIBC, FERRITIN, IRONPCTSAT Lipid Panel  No results found for: CHOL, TRIG, HDL, CHOLHDL, VLDL, LDLCALC, LDLDIRECT Hepatic Function Panel     Component Value Date/Time   PROT 7.6 01/10/2023 1129   PROT 7.1 04/05/2018 1528   ALBUMIN 4.0 01/10/2023 1129   ALBUMIN 4.0 04/05/2018 1528   AST 25 01/10/2023 1129    ALT 28 01/10/2023 1129   ALKPHOS 46 01/10/2023 1129   BILITOT 0.4 01/10/2023 1129   BILITOT 0.3 04/05/2018 1528      Component Value Date/Time   TSH 2.001 01/10/2023 1129   TSH 1.240 04/05/2018 1528     Assessment and Plan   IIH (idiopathic intracranial hypertension)  Chronic nonintractable headache, unspecified headache type  Optic nerve edema  Class 1 obesity due to excess calories with serious comorbidity and body mass index (BMI) of 33.0 to 33.9 in adult  Current BMI 33.4  Assessment and Plan Assessment & Plan Obesity She has experienced weight gain since 2018, with a notable increase over the past year. Intracranial hypertension necessitates weight management to preserve function, particularly ocular health. She desires to lose 20-30 pounds and improve eating habits, energy levels, and physical activity. Previous weight loss strategies, including low-carb diets and exercise, have not yielded significant results. She is interested in the clinic's obesity management program, which includes dietary changes, exercise, and possibly medication if indicated. The program emphasizes sustainable lifestyle changes and regular follow-up to monitor progress. It includes a comprehensive assessment with fasting labs and a metabolism test to tailor a nutrition plan. The program aims to reduce her body fat percentage to less than 30%, ideally towards 25%, to lower the risk of chronic diseases such as insulin resistance, type  2 diabetes, myocardial infarction, cerebrovascular accident, and certain cancers and to improve her IIH. - Provide new patient packet for obesity management program - Schedule initial visit for program enrollment, including fasting labs and metabolism test - Set up a nutrition plan based on metabolism results - Encourage continuation of weight training and cardio exercise - Discuss potential use of weight loss medications if clinically indicated - Offer telemedicine  consultation with psychologist for emotional or stress eating  Intracranial Hypertension Previously managed with medication, she is currently not on any medication for this condition. Weight management is crucial to prevent exacerbation of symptoms and preserve neurological function. The obesity management program aims to address this by reducing adipose tissue while maintaining muscle mass. - Monitor weight and intracranial hypertension symptoms - Incorporate weight management strategies as part of obesity management program  Hyperlipidemia She reports occasional hypercholesterolemia on blood work, potentially related to obesity and dietary habits. The obesity management program aims to address this through dietary modifications and weight loss, which are expected to improve cholesterol levels. - Include lipid profile in fasting labs during initial program visit - Implement dietary changes to improve cholesterol levels as part of obesity management program  General Health Maintenance She is interested in improving overall health and quality of life through weight loss and lifestyle changes. The clinic's program focuses on sustainable health practices and regular monitoring to prevent chronic diseases associated with obesity. The program includes education on healthy eating patterns, portion control, and the importance of regular physical activity. - Educate on healthy eating patterns and portion control - Encourage regular physical activity - Provide anticipatory guidance on potential health risks associated with obesity  Follow-up She is considering enrolling in the obesity management program and has been provided with information to make an informed decision. Regular follow-up is essential to monitor progress and adjust the treatment plan as needed. The program offers flexibility in follow-up frequency based on her progress and schedule. - Follow up every 2-3 weeks for the first three months  after program enrollment - Adjust follow-up frequency to monthly or every six weeks based on her progress and schedule - Encourage her to contact clinic with any questions or concerns        Obesity Treatment / Action Plan:  Patient will work on garnering support from family and friends to begin weight loss journey. Will work on eliminating or reducing the presence of highly palatable, calorie dense foods in the home. Will complete provided nutritional and psychosocial assessment questionnaire before the next appointment. Will be scheduled for indirect calorimetry to determine resting energy expenditure in a fasting state.  This will allow us  to create a reduced calorie, high-protein meal plan to promote loss of fat mass while preserving muscle mass. Will think about ideas on how to incorporate physical activity into their daily routine. Will avoid skipping meals which may result in increased hunger signals and overeating at certain times. Will work on managing stress via relaxation methods as this may result in unhealthy eating patterns. Will work on reading labels, making healthier choices and watching portion sizes. Counseled on the health benefits of losing 5%-15% of total body weight. Will work on improving sleep hygiene and trying to obtain at least 7 hours of sleep. Was counseled on nutritional approaches to weight loss and benefits of reducing processed foods and consuming plant-based foods and high quality protein as part of nutritional weight management. Was counseled on pharmacotherapy and role as an adjunct in weight management.  Will work  on increasing water intake with a goal of 125 ounces for men and 91 ounces for women.   Obesity Education Performed Today:  She was weighed on the bioimpedance scale and results were discussed and documented in the synopsis.  We discussed obesity as a disease and the importance of a more detailed evaluation of all the factors contributing to  the disease.  We discussed the importance of long term lifestyle changes which include nutrition, exercise and behavioral modifications as well as the importance of customizing this to her specific health and social needs.  We discussed the benefits of reaching a healthier weight to alleviate the symptoms of existing conditions and reduce the risks of the biomechanical, metabolic and psychological effects of obesity.  We reviewed the four pillars of obesity medicine and importance of using a multimodal approach.  We reviewed the basic principles in weight management.   Nadia E Eisemann appears to be in the action stage of change and states they are ready to start intensive lifestyle modifications and behavioral modifications.  I have spent 34 minutes in the care of the patient today including: 2 minutes before the visit reviewing and preparing the chart. 28 minutes face-to-face assessing and reviewing listed medical problems as outlined in obesity care plan, providing nutritional and behavioral counseling on topics outlined in the obesity care plan, independently interpreting test results and goals of care, as described in assessment and plan, reviewing and discussing biometric information and progress, and reviewing latest PCP notes and specialist consultations 4 minutes after the visit updating chart and documentation of encounter.  Reviewed by clinician on day of visit: allergies, medications, problem list, medical history, surgical history, family history, social history, and previous encounter notes pertinent to obesity diagnosis.   Maryori Weide,PA-C

## 2024-05-22 ENCOUNTER — Ambulatory Visit (INDEPENDENT_AMBULATORY_CARE_PROVIDER_SITE_OTHER): Admitting: Physician Assistant

## 2024-05-22 ENCOUNTER — Encounter (INDEPENDENT_AMBULATORY_CARE_PROVIDER_SITE_OTHER): Payer: Self-pay | Admitting: Physician Assistant

## 2024-05-22 VITALS — BP 113/73 | HR 98 | Temp 98.1°F | Ht 62.0 in | Wt 182.6 lb

## 2024-05-22 DIAGNOSIS — G932 Benign intracranial hypertension: Secondary | ICD-10-CM | POA: Diagnosis not present

## 2024-05-22 DIAGNOSIS — E66811 Obesity, class 1: Secondary | ICD-10-CM

## 2024-05-22 DIAGNOSIS — H4710 Unspecified papilledema: Secondary | ICD-10-CM | POA: Diagnosis not present

## 2024-05-22 DIAGNOSIS — R519 Headache, unspecified: Secondary | ICD-10-CM

## 2024-05-22 DIAGNOSIS — Z6833 Body mass index (BMI) 33.0-33.9, adult: Secondary | ICD-10-CM

## 2024-05-22 DIAGNOSIS — G8929 Other chronic pain: Secondary | ICD-10-CM

## 2024-05-22 DIAGNOSIS — E6609 Other obesity due to excess calories: Secondary | ICD-10-CM

## 2024-09-30 DIAGNOSIS — L209 Atopic dermatitis, unspecified: Secondary | ICD-10-CM | POA: Diagnosis not present

## 2024-09-30 DIAGNOSIS — Z0289 Encounter for other administrative examinations: Secondary | ICD-10-CM

## 2024-10-18 ENCOUNTER — Encounter: Payer: Self-pay | Admitting: Family Medicine

## 2024-10-21 ENCOUNTER — Telehealth: Payer: Self-pay | Admitting: Family Medicine

## 2024-10-21 ENCOUNTER — Encounter: Payer: Self-pay | Admitting: Family Medicine

## 2024-10-21 ENCOUNTER — Ambulatory Visit: Admitting: Family Medicine

## 2024-10-21 NOTE — Telephone Encounter (Signed)
 Reviewed chart and saw where pt had canceled appt.

## 2024-10-21 NOTE — Telephone Encounter (Signed)
 Pt called to Cancel appt due to  Work scheduled  appt Cancel

## 2024-10-23 ENCOUNTER — Encounter (INDEPENDENT_AMBULATORY_CARE_PROVIDER_SITE_OTHER): Payer: Self-pay | Admitting: Nurse Practitioner

## 2024-10-23 ENCOUNTER — Ambulatory Visit (INDEPENDENT_AMBULATORY_CARE_PROVIDER_SITE_OTHER): Admitting: Nurse Practitioner

## 2024-10-23 VITALS — BP 105/70 | HR 95 | Temp 98.5°F | Ht 62.0 in | Wt 184.0 lb

## 2024-10-23 DIAGNOSIS — Z6833 Body mass index (BMI) 33.0-33.9, adult: Secondary | ICD-10-CM

## 2024-10-23 DIAGNOSIS — Z1331 Encounter for screening for depression: Secondary | ICD-10-CM | POA: Insufficient documentation

## 2024-10-23 DIAGNOSIS — R0602 Shortness of breath: Secondary | ICD-10-CM | POA: Insufficient documentation

## 2024-10-23 DIAGNOSIS — R5383 Other fatigue: Secondary | ICD-10-CM

## 2024-10-23 DIAGNOSIS — G932 Benign intracranial hypertension: Secondary | ICD-10-CM | POA: Diagnosis not present

## 2024-10-23 DIAGNOSIS — E7800 Pure hypercholesterolemia, unspecified: Secondary | ICD-10-CM

## 2024-10-23 DIAGNOSIS — E559 Vitamin D deficiency, unspecified: Secondary | ICD-10-CM

## 2024-10-23 DIAGNOSIS — E66811 Obesity, class 1: Secondary | ICD-10-CM | POA: Diagnosis not present

## 2024-10-23 DIAGNOSIS — T733XXA Exhaustion due to excessive exertion, initial encounter: Secondary | ICD-10-CM | POA: Insufficient documentation

## 2024-10-23 NOTE — Progress Notes (Signed)
 [address] [place],  [place], [place] [address]  Office: [phone]  /  Fax: [phone]   Subjective   Initial Visit  Joanne Summers (MR# [id number]) is a 38 y.o. female who presents for evaluation and treatment of obesity and related comorbidities. Current BMI is Body mass index is 33.65 kg/m. [name] has been struggling with her weight for many years and has been unsuccessful in either losing weight, maintaining weight loss, or reaching her healthy weight goal.  [name] is currently in the action stage of change and ready to dedicate time achieving and maintaining a healthier weight. [name] is interested in becoming our patient and working on intensive lifestyle modifications including (but not limited to) diet and exercise for weight loss.  Weight history:  [name] has experienced weight gain since 2018, initially she was able to manage her weight through exercise despite not following a strict diet. Over the past year, her weight has increased continuously despite dietary adjustments. Shehas a lot of sweets cravings and struggles to avoid sweets even after healthy meals. She has tried various weight loss strategies, including low carb, ketogenic diets, and intermittent fasting, without significant success.    She does follow a low carb, high protein diet and engages in physical activities such as walking for an hour three times a week, jogging for short periods, and doing cardio exercises via YouTube videos. Her highest weight is her current weight, and she desires to lose 20 to 30 pounds.    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 She describes a hectic lifestyle and acknowledges a need for convenience in her eating habits. She has not undergone metabolic surgery and has made multiple weight loss attempts in the past.   She was referred by her neurologist who she sees for idiopathic intracranial hypertension. She is currently on no medication.    When asked how their weight has  affected their life and health, she states: Has affected self-esteem, Relationships, Contributed to medical problems, Having fatigue, Having poor endurance, and Has affected mood   When asked what else they would like to accomplish? She states: Adopt a healthier eating pattern and lifestyle, Improve energy levels and physical activity, Improve existing medical conditions, Improve quality of life, Improve appearance, Improve self-confidence, and Lose 20-30 lbs  She starting to note weight gain during : adulthood.  Life events associated with weight gain include : job change.   Other contributing factors: family history of obesity, consumption of processed foods, strong orexigenic signaling and/or inadequate inhibitory control , slow metabolism for age, need for convenience due to lack of time, enticing relationships and enviroment, multiple weight loss attempts in the past, sedentary job, hectic pace of life, need for convenient foods, and self - critic or all-or-none mindset.  Their highest weight has been:  182 lbs.  Desired weight: 150  Previous weight-loss programs : Low Carb, Ketogenic, Tracking and Journaling, Meal Replacements, Balanced Plate / Portion Control, Intermittent fasting, and High Protein.  Their greatest challenge with dieting: craves sweets.  Current or previous pharmacotherapy: None.  Response to medication: Never tried medications   Nutritional History:  Current nutrition plan: Low-carb and High-protein.  How many times do you eat outside the home: 2-4 per week  How often do they skip meals: does not skip meals  What beverages do they drink: water, smoothies, and unsweetened tea.   Use of artificial sweetners : Yes  Food intolerances or dislikes: none.  Food triggers: Seeking reward and To help comfort self.  Food cravings: Sugary  Do they struggle with excessive hunger or portion control : No    Physical Activity:  Current level of physical  activity: Walking 60 minutes, four  a week- will do 30-60 minute you tube videos  Barriers to Exercise: no barriers   Past medical history includes:   Past Medical History:  Diagnosis Date   Headache    High cholesterol    Lactose intolerance    Low iron    PONV (postoperative nausea and vomiting)      Objective   BP 105/70   Pulse 95   Temp 98.5 F (36.9 C)   Ht 5' 2 (1.575 m)   Wt 184 lb (83.5 kg)   LMP 10/15/2024   SpO2 96%   BMI 33.65 kg/m  She was weighed on the bioimpedance scale: Body mass index is 33.65 kg/m.    Anthropometrics:  Vitals Temp: 98.5 F (36.9 C) BP: 105/70 Pulse Rate: 95 SpO2: 96 %   Anthropometric Measurements Height: 5' 2 (1.575 m) Weight: 184 lb (83.5 kg) BMI (Calculated): 33.65 Starting Weight: 184 lb Peak Weight: 184 lb Waist Measurement : 39 inches   Body Composition  Body Fat %: 37 % Fat Mass (lbs): 68.2 lbs Muscle Mass (lbs): 110.4 lbs Total Body Water (lbs): 73.6 lbs Visceral Fat Rating : 8   Other Clinical Data RMR: 1829 Fasting: yes Labs: yes Today's Visit #: 1 Starting Date: 10/23/24    Physical Exam:  General: She is overweight, cooperative, alert, well developed, and in no acute distress. PSYCH: Has normal mood, affect and thought process.   HEENT: EOMI, sclerae are anicteric. Lungs: Normal breathing effort, no conversational dyspnea. Extremities: No edema.  Neurologic: No gross sensory or motor deficits. No tremors or fasciculations noted.    Diagnostic Data Reviewed  EKG: Normal sinus rhythm, rate 76. No conduction abnormalities, abnormal Q waves or chamber enlargement.  Indirect Calorimeter completed today shows a VO2 of 266 and a REE of 1829.  Her calculated basal metabolic rate is 8402 thus her resting energy expenditure faster than calculated.  Depression Screen  Rocklyn's PHQ-9 score was: 0.     10/23/2024    9:05 AM  Depression screen PHQ 2/9  Decreased Interest 0  Down, Depressed,  Hopeless 0  PHQ - 2 Score 0    Screening for Sleep Related Breathing Disorders  Gentri denies daytime somnolence and admits to waking up still tired. Patient has a history of symptoms of morning fatigue. Amberly generally gets 6 hours of sleep per night, and states that she has generally restful sleep. Snoring is present. Apneic episodes are not present. Epworth Sleepiness Score is 1.    Last labs at [facility] 11/17/23 Lipid Panel w/reflex Reviewed date:11/17/2023 02:53:06 PM Interpretation:LDL: 150 Performing Lab: Notes/Report: Testing Performed at: [facility], [address], [address], [place], [place] [address]  Cholesterol 214 <200 mg/dL    CHOL/HDL 4.4 7.9-5.9 Ratio    HDLD 48 30-85 mg/dL Values below 40 mg/dL indicate increased risk factor  Triglyceride 91 0-199 mg/dL    NHDL 833 9-870 mg/dL Range dependent upon risk factors.  LDL Chol Calc (NIH) 150 0-99 mg/dL    Iron Panel Reviewed date:11/17/2023 02:53:06 PM Interpretation:Abnormal Performing Lab: Notes/Report: Testing Performed at: [facility], [address], [address], [place], [place] [address]  Iron, serum 118 50-212 ug/dL    TRFN 799 796-637 mg/dL    IRSAT 42 79-44 %    TIBC 280 250-450 ug/dL  Ferritin Reviewed date:11/17/2023 02:53:06 PM Interpretation:Normal Performing Lab: Notes/Report: Testing Performed at: [facility], [address], [address], [place], [place] [address]  FERR 66.0 11.0-306.8 ng/mL    Comp Metabolic Panel Reviewed date:11/17/2023 02:53:06 PM Interpretation:CO2: 19, CL: 111 Performing Lab: Notes/Report: Testing Performed at: [facility], [address], [address], [place], [place] [address]  Glucose 77 70-99 mg/dL    BUN 15 3-73 mg/dL    Creatinine 9.08 9.39-8.69 mg/dl    [id number] 82 >39 calc In accordance with recommendations from NKF-ASN Task Force, [facility] has updated its eGFR calc to the 2021 CKD-EDI equation that estimates kidney function without a race variable;Stage 1 > 90 ML/Min  plus Albuminuria;Stage 2 60-89 ML/MIN;Stage 3 30-59 ML/MIN;Stage 4 15-29 ML/MIN;Stage 5 <15 ML/MIN  Sodium 138 136-145 mmol/L    Potassium 3.8 3.5-5.5 mmol/L    Chloride 111 98-107 mmol/L    CO2 19 22-32 mmol/L    Anion Gap 11.5 6.0-20.0 mmol/L    Calcium 8.9 8.6-10.3 mg/dL    CA-corrected 1.15 1.39-89.69 mg/dL    Protein, Total 7.1 3.9-1.6 g/dL    Albumin 4.1 6.5-5.1 g/dL    TBIL 0.6 9.6-8.9 mg/dL    ALP 46 61-873 U/L    AST 21 0-39 U/L    ALT 26 0-52 U/L    CBC without Diff Reviewed date:11/17/2023 02:53:06 PM Interpretation:Hgb: 12.2 Performing Lab: Notes/Report: Testing Performed at: [facility], [address], [address], [place], [place] [address]  WBC 9.3 4.0-11.0 K/ul    RBC 3.93 4.20-5.40 M/uL    HGB 12.2 12.0-16.0 g/dL    HCT 62.7 62.9-52.9 %    MCV 94.6 81.0-99.0 fL    MCH 31.1 27.0-33.0 pg    MCHC 32.9 32.0-36.0 g/dL    RDW 85.9 88.4-84.4 %    PLT 366 150-400 K/uL     Assessment and Plan   TREATMENT PLAN FOR OBESITY:  Recommended Dietary Goals  Kierra is currently in the action stage of change. As such, her goal is to implement medically supervised obesity management plan.  She has agreed to implement: the Category 2 plan - 1200 kcal per day  Behavioral Intervention  We discussed the following Behavioral Modification Strategies today: increasing lean protein intake to established goals, decreasing simple carbohydrates , increasing vegetables, increasing lower glycemic fruits, increasing fiber rich foods, avoiding skipping meals, increasing water intake, work on meal planning and preparation, reading food labels , keeping healthy foods at home, identifying sources and decreasing liquid calories, decreasing eating out or consumption of processed foods, and making healthy choices when eating convenient foods, planning for success, and better snacking choices  Additional resources provided today: Handout on healthy eating and balanced plate, Handout on complex  carbohydrates and lean sources of protein, Category 2 packet, and Handout principles of weight management  Recommended Physical Activity Goals  Aleiah has been advised to work up to 150 minutes of moderate intensity aerobic activity a week and strengthening exercises 2-3 times per week for cardiovascular health, weight loss maintenance and preservation of muscle mass.   She has agreed to :  Continue current level of physical activity  and Increase physical activity in their day and reduce sedentary time (increase NEAT).  Medical Interventions and Pharmacotherapy We will work on building a therapist, art and behavioral strategies. We will discuss the role of pharmacotherapy as an adjunct at subsequent visits.   ASSOCIATED CONDITIONS ADDRESSED TODAY  Other Fatigue Jerri does feel that her weight is causing her energy to be  lower than it should be. Fatigue may be related to obesity, depression or many other causes. Labs will be ordered, and in the meanwhile, Tyree will focus on self care including making healthy food choices, increasing physical activity and focusing on stress reduction. - EKG 12 lead  Shortness of Breath on Exertion Tasheika notes increasing shortness of breath with physical activity and seems to be worsening over time with weight gain. She notes getting out of breath sooner with activity than she used to. This has not gotten worse recently. Yennifer denies shortness of breath at rest or orthopnea.  Fatigue due to excessive exertion, initial encounter -     EKG 12-Lead  Depression screening - PHQ 9 was 0- no further intervention necessary  IIH (idiopathic intracranial hypertension)       Currently on no medication, continue to follow regularly with neurology and monitor symptoms  Pure hypercholesterolemia Focus on implementing category 2 meal plan, limit saturated fats Loss of 10-15% body weight can improve lipid levels Continue current exercise  regimen Continue to follow regularly with PCP -     Comprehensive metabolic panel with GFR -     Lipid panel  Class 1 obesity with serious comorbidity and body mass index (BMI) of 33.0 to 33.9 in adult, unspecified obesity type See plan above -     CBC with Differential/Platelet -     Comprehensive metabolic panel with GFR -     Hemoglobin A1c -     Insulin , random -     Lipid panel -     Magnesium -     TSH -     Vitamin B12 -     VITAMIN D  25 Hydroxy (Vit-D Deficiency, Fractures)  Vitamin D  deficiency If Vit D level remains low will supplement with Ergocalciferol  50000 units once a week for 12 weeks and then recheck level.   -     VITAMIN D  25 Hydroxy (Vit-D Deficiency, Fractures)    Follow-up  She was informed of the importance of frequent follow-up visits to maximize her success with intensive lifestyle modifications for her multiple health conditions. She was informed we would discuss her lab results at her next visit unless there is a critical issue that needs to be addressed sooner. Elvia agreed to keep her next visit at the agreed upon time to discuss these results.  Attestation Statement  This is the patient's intake visit at [facility]. The patient's Health Questionnaire was reviewed at length. Included in the packet: current and past health history, medications, allergies, ROS, gynecologic history (women only), surgical history, family history, social history, weight history, weight loss surgery history (for those that have had weight loss surgery), nutritional evaluation, mood and food questionnaire, PHQ9, Epworth questionnaire, sleep habits questionnaire, patient life and health improvement goals questionnaire. These will all be scanned into the patient's chart under media.   During the visit, I independently reviewed the patient's EKG, previous labs, bioimpedance scale results, and indirect calorimetry results. I used this information to medically tailor  a meal plan for the patient that will help her to lose weight and will improve her obesity-related conditions. I performed a medically necessary appropriate examination and/or evaluation. I discussed the assessment and treatment plan with the patient. The patient was provided an opportunity to ask questions and all were answered. The patient agreed with the plan and demonstrated an understanding of the instructions. Labs were ordered at this visit and will be reviewed at the next visit unless  critical results need to be addressed immediately. Clinical information was updated and documented in the EMR.   In addition, they received basic education on identification of processed foods and reduction of these, different sources of lean proteins and complex carbohydrates and how to eat balanced by incorporation of whole foods.  Reviewed by clinician on day of visit: allergies, medications, problem list, medical history, surgical history, family history, social history, and previous encounter notes.  I personally spent a total of 40 minutes in the care of the patient today including preparing to see the patient, getting/reviewing separately obtained history, performing a medically appropriate exam/evaluation, counseling and educating, placing orders, and documenting clinical information in the EHR. Does not include time for EKG, Indirect calorimetry or depression screening  Lonell Liverpool ANP-C

## 2024-10-24 ENCOUNTER — Ambulatory Visit (INDEPENDENT_AMBULATORY_CARE_PROVIDER_SITE_OTHER): Payer: Self-pay | Admitting: Nurse Practitioner

## 2024-10-24 LAB — COMPREHENSIVE METABOLIC PANEL WITH GFR
ALT: 28 IU/L (ref 0–32)
AST: 24 IU/L (ref 0–40)
Albumin: 4.3 g/dL (ref 3.9–4.9)
Alkaline Phosphatase: 55 IU/L (ref 41–116)
BUN/Creatinine Ratio: 13 (ref 9–23)
BUN: 12 mg/dL (ref 6–24)
Bilirubin Total: 0.3 mg/dL (ref 0.0–1.2)
CO2: 22 mmol/L (ref 20–29)
Calcium: 9 mg/dL (ref 8.7–10.2)
Chloride: 104 mmol/L (ref 96–106)
Creatinine, Ser: 0.91 mg/dL (ref 0.57–1.00)
Globulin, Total: 2.8 g/dL (ref 1.5–4.5)
Glucose: 84 mg/dL (ref 70–99)
Potassium: 4.2 mmol/L (ref 3.5–5.2)
Sodium: 140 mmol/L (ref 134–144)
Total Protein: 7.1 g/dL (ref 6.0–8.5)
eGFR: 82 mL/min/1.73 (ref >59)

## 2024-10-24 LAB — LIPID PANEL
Chol/HDL Ratio: 4.6 ratio — ABNORMAL HIGH (ref 0.0–4.4)
Cholesterol, Total: 240 mg/dL — ABNORMAL HIGH (ref 100–199)
HDL: 52 mg/dL (ref >39)
LDL Chol Calc (NIH): 175 mg/dL — ABNORMAL HIGH (ref 0–99)
Triglycerides: 78 mg/dL (ref 0–149)
VLDL Cholesterol Cal: 13 mg/dL (ref 5–40)

## 2024-10-24 LAB — TSH: TSH: 2.82 u[IU]/mL (ref 0.450–4.500)

## 2024-10-24 LAB — CBC WITH DIFFERENTIAL/PLATELET
Basophils Absolute: 0.1 x10E3/uL (ref 0.0–0.2)
Basos: 1 %
EOS (ABSOLUTE): 0.1 x10E3/uL (ref 0.0–0.4)
Eos: 1 %
Hematocrit: 39.5 % (ref 34.0–46.6)
Hemoglobin: 12.8 g/dL (ref 11.1–15.9)
Immature Grans (Abs): 0 x10E3/uL (ref 0.0–0.1)
Immature Granulocytes: 0 %
Lymphocytes Absolute: 2.5 x10E3/uL (ref 0.7–3.1)
Lymphs: 32 %
MCH: 31.4 pg (ref 26.6–33.0)
MCHC: 32.4 g/dL (ref 31.5–35.7)
MCV: 97 fL (ref 79–97)
Monocytes Absolute: 0.5 x10E3/uL (ref 0.1–0.9)
Monocytes: 6 %
Neutrophils Absolute: 4.6 x10E3/uL (ref 1.4–7.0)
Neutrophils: 60 %
Platelets: 414 x10E3/uL (ref 150–450)
RBC: 4.08 x10E6/uL (ref 3.77–5.28)
RDW: 12.1 % (ref 11.7–15.4)
WBC: 7.9 x10E3/uL (ref 3.4–10.8)

## 2024-10-24 LAB — MAGNESIUM: Magnesium: 2.1 mg/dL (ref 1.6–2.3)

## 2024-10-24 LAB — HEMOGLOBIN A1C
Est. average glucose Bld gHb Est-mCnc: 108 mg/dL
Hgb A1c MFr Bld: 5.4 % (ref 4.8–5.6)

## 2024-10-24 LAB — VITAMIN D 25 HYDROXY (VIT D DEFICIENCY, FRACTURES): Vit D, 25-Hydroxy: 31.7 ng/mL (ref 30.0–100.0)

## 2024-10-24 LAB — VITAMIN B12: Vitamin B-12: 772 pg/mL (ref 232–1245)

## 2024-10-24 LAB — INSULIN, RANDOM: INSULIN: 45.7 u[IU]/mL — ABNORMAL HIGH (ref 2.6–24.9)

## 2024-11-04 ENCOUNTER — Encounter (INDEPENDENT_AMBULATORY_CARE_PROVIDER_SITE_OTHER): Payer: Self-pay | Admitting: Nurse Practitioner

## 2024-11-04 ENCOUNTER — Ambulatory Visit (INDEPENDENT_AMBULATORY_CARE_PROVIDER_SITE_OTHER): Admitting: Nurse Practitioner

## 2024-11-04 VITALS — BP 119/74 | HR 84 | Temp 98.8°F | Ht 62.0 in | Wt 182.0 lb

## 2024-11-04 DIAGNOSIS — Z6833 Body mass index (BMI) 33.0-33.9, adult: Secondary | ICD-10-CM | POA: Diagnosis not present

## 2024-11-04 DIAGNOSIS — E559 Vitamin D deficiency, unspecified: Secondary | ICD-10-CM

## 2024-11-04 DIAGNOSIS — E88819 Insulin resistance, unspecified: Secondary | ICD-10-CM

## 2024-11-04 DIAGNOSIS — E7800 Pure hypercholesterolemia, unspecified: Secondary | ICD-10-CM | POA: Diagnosis not present

## 2024-11-04 DIAGNOSIS — G932 Benign intracranial hypertension: Secondary | ICD-10-CM

## 2024-11-04 DIAGNOSIS — E66811 Obesity, class 1: Secondary | ICD-10-CM

## 2024-11-04 MED ORDER — METFORMIN HCL ER 500 MG PO TB24: 500.0000 mg | ORAL_TABLET | Freq: Every day | ORAL | 0 refills | Status: DC

## 2024-11-04 MED ORDER — VITAMIN D (ERGOCALCIFEROL) 1.25 MG (50000 UNIT) PO CAPS: 50000.0000 [IU] | ORAL_CAPSULE | ORAL | 1 refills | Status: DC

## 2024-11-04 NOTE — Progress Notes (Signed)
 " Office: [phone]  /  Fax: [phone]  WEIGHT SUMMARY AND BIOMETRICS  Weight Lost Since Last Visit: 2 lb  Weight Gained Since Last Visit: 0   Vitals Temp: 98.8 F (37.1 C) BP: 119/74 Pulse Rate: 84 SpO2: 95 %   Anthropometric Measurements Height: 5' 2 (1.575 m) Weight: 182 lb (82.6 kg) BMI (Calculated): 33.28 Weight at Last Visit: 184 lb Weight Lost Since Last Visit: 2 lb Weight Gained Since Last Visit: 0 Starting Weight: 184 lb Total Weight Loss (lbs): 2 lb (0.907 kg) Peak Weight: 184 lb Waist Measurement : 39 inches   Body Composition  Body Fat %: 36.3 % Fat Mass (lbs): 66.2 lbs Muscle Mass (lbs): 110.4 lbs Total Body Water (lbs): 74.8 lbs Visceral Fat Rating : 8   Other Clinical Data Fasting: no Labs: no Today's Visit #: 2 Starting Date: 10/23/24    Total Weight Loss:2 pounds Bio Impedance Data reviewed with patient: Muscle stayed the same, adipose is down 2 pounds.   HPI  Chief Complaint: OBESITY  Joanne Summers is here to discuss her progress with her obesity treatment plan. She is on the the Category 2 Plan and states she is following her eating plan approximately 95 % of the time. She states she is exercising 60 minutes 4 days per week.   Interval History:  Since last office visit she has been trying to get 80-100 grams of protein,has been falling short.  She had more foods off plan over the holiday. She did have some sweet tea and regular soda.  She has been using mini kind bars and Yasso bars for her sweets cravings.  Breakfast- 2 eggs with greek yogurt with blueberries.  Lunch: salmon, ground turkey or chicken 4-8 ounces with vegetables. Dinner: salmon, ground turkey or chicken 4-8 ounces with vegetables She has been drinking maybe 32-64 ounces of water  She does have idiopathic intracranial hypertension and is currently controlled without medication.   Lab results are reviewed with patient in detail. Kidney functions, electrolytes, fasting  glucose, Vit B12, CBC, A1c, TSH and liver enzymes are all normal. Lipid panel shows Hypercholesterolemia. Vit D is low normal and would benefit from supplementation.Fasting insulin  is elevated at 45.7 with HOMA-IR score of 9.47 indicating insulin  resistance.   ASCVD risk is reviewed with patient as obesity is a risk factor for cardiovascular disease  The 10-year ASCVD risk score (Arnett DK, et al., 2019) is: 0.5%   Values used to calculate the score:     Age: 29 years     Clinically relevant sex: Female     Is Non-Hispanic African American: Yes     Diabetic: No     Tobacco smoker: No     Systolic Blood Pressure: 119 mmHg     Is BP treated: No     HDL Cholesterol: 52 mg/dL     Total Cholesterol: 240 mg/dL   Cancer screenings are reviewed with patient as obesity is a risk cancer for certain cancers.   Pap: 08/29/22 negative Mammogram: will schedule in the next year.    PHYSICAL EXAM:  Blood pressure 119/74, pulse 84, temperature 98.8 F (37.1 C), height 5' 2 (1.575 m), weight 182 lb (82.6 kg), last menstrual period 10/15/2024, SpO2 95%. Body mass index is 33.29 kg/m.  General: Well Developed, well nourished, and in no acute distress.  HEENT: Normocephalic, atraumatic; EOMI, sclerae are anicteric. Skin: Warm and dry, good turgor Chest:  Normal excursion, shape, no gross ABN Respiratory: No conversational dyspnea; speaking in full  sentences NeuroM-Sk:  Normal gross ROM * 4 extremities  Psych: A and O X 3, insight adequate, mood- full    DIAGNOSTIC DATA REVIEWED:  BMET    Component Value Date/Time   NA 140 10/23/2024 0929   K 4.2 10/23/2024 0929   CL 104 10/23/2024 0929   CO2 22 10/23/2024 0929   GLUCOSE 84 10/23/2024 0929   GLUCOSE 100 (H) 01/10/2023 1135   BUN 12 10/23/2024 0929   CREATININE 0.91 10/23/2024 0929   CALCIUM 9.0 10/23/2024 0929   GFRNONAA >60 01/10/2023 1129   GFRAA 101 04/05/2018 1528   Lab Results  Component Value Date   HGBA1C 5.4 10/23/2024    Lab Results  Component Value Date   INSULIN  45.7 (H) 10/23/2024   Lab Results  Component Value Date   TSH 2.820 10/23/2024   CBC    Component Value Date/Time   WBC 7.9 10/23/2024 0929   WBC 8.2 01/10/2023 1129   RBC 4.08 10/23/2024 0929   RBC 3.90 01/10/2023 1129   HGB 12.8 10/23/2024 0929   HCT 39.5 10/23/2024 0929   PLT 414 10/23/2024 0929   MCV 97 10/23/2024 0929   MCH 31.4 10/23/2024 0929   MCH 31.0 01/10/2023 1129   MCHC 32.4 10/23/2024 0929   MCHC 32.1 01/10/2023 1129   RDW 12.1 10/23/2024 0929    Lipid Panel     Component Value Date/Time   CHOL 240 (H) 10/23/2024 0929   TRIG 78 10/23/2024 0929   HDL 52 10/23/2024 0929   CHOLHDL 4.6 (H) 10/23/2024 0929   LDLCALC 175 (H) 10/23/2024 0929   Hepatic Function Panel     Component Value Date/Time   PROT 7.1 10/23/2024 0929   ALBUMIN 4.3 10/23/2024 0929   AST 24 10/23/2024 0929   ALT 28 10/23/2024 0929   ALKPHOS 55 10/23/2024 0929   BILITOT 0.3 10/23/2024 0929      Component Value Date/Time   TSH 2.820 10/23/2024 0929   Nutritional Lab Results  Component Value Date   VD25OH 31.7 10/23/2024     ASSESSMENT AND PLAN Class 1 obesity with serious comorbidity and body mass index (BMI) of 33.0 to 33.9 in adult, unspecified obesity type TREATMENT PLAN FOR OBESITY:  Recommended Dietary Goals  Pattijo is currently in the action stage of change. As such, her goal is to continue weight management plan. She has agreed to the Category 2 Plan.  Behavioral Intervention  We discussed the following Behavioral Modification Strategies today: increasing lean protein intake to established goals, decreasing simple carbohydrates , increasing fiber rich foods, avoiding skipping meals, increasing water intake , work on meal planning and preparation, decreasing eating out or consumption of processed foods, and making healthy choices when eating convenient foods, better snacking choices, continue to work on maintaining a  reduced calorie state, getting the recommended amount of protein, incorporating whole foods, making healthy choices, staying well hydrated and practicing mindfulness when eating., and increase protein intake, fibrous foods (25 grams per day for women, 30 grams for men) and water to improve satiety and decrease hunger signals. .    Recommended Physical Activity Goals  Temia has been advised to work up to 240 minutes of moderate intensity aerobic activity a week and strengthening exercises 2-3 times per week for cardiovascular health, weight loss maintenance and preservation of muscle mass.   She has agreed to Continue current level of physical activity  and Combine aerobic and strengthening exercises for efficiency and improved cardiometabolic health.   Pharmacotherapy We  discussed various medication options to help Naraya with her weight loss efforts and we both agreed to start Metformin  XR 500 mg every day for insulin  resistance.  Start ergocalciferol  50000 units once a week for Vit D deficiency.  ASSOCIATED CONDITIONS ADDRESSED TODAY  Action/Plan  Insulin  resistance Continue Category 2  meal plan, limit simple carbohydrates Decreasing body weight by 10-15% can improve glucose levels Start Metformin  XR 500 mg every day with largest meal.  Continue exercise with current goal of 240 minutes of moderate to high intensity exercise/week.  -     metFORMIN  HCl ER; Take 1 tablet (500 mg total) by mouth daily with breakfast.  Dispense: 30 tablet; Refill: 0  IIH (idiopathic intracranial hypertension)       Continue to follow with PCP and neurology- currently controlled without medication       Monitor symptoms  Pure hypercholesterolemia Focus on implementing category 2 meal plan, limit saturated fats and simple carbs.  Increase lean protein, fiber and water.  Loss of 10-15% body weight can improve lipid levels Focus on getting 240 minutes a week of moderate to high intensity exercise with  strength training.   Vitamin D  deficiency Supplement with Ergocalciferol  50000 units once a week  -     Vitamin D  (Ergocalciferol ); Take 1 capsule (50,000 Units total) by mouth every 7 (seven) days.  Dispense: 5 capsule; Refill: 1           Return in about 3 weeks (around 11/25/2024).SABRA She was informed of the importance of frequent follow up visits to maximize her success with intensive lifestyle modifications for her multiple health conditions.   ATTESTASTION STATEMENTS:  Reviewed by clinician on day of visit: allergies, medications, problem list, medical history, surgical history, family history, social history, and previous encounter notes.     Lonell Liverpool ANP-C "

## 2024-11-27 ENCOUNTER — Ambulatory Visit (INDEPENDENT_AMBULATORY_CARE_PROVIDER_SITE_OTHER): Admitting: Nurse Practitioner

## 2024-11-27 ENCOUNTER — Other Ambulatory Visit (INDEPENDENT_AMBULATORY_CARE_PROVIDER_SITE_OTHER): Payer: Self-pay | Admitting: Nurse Practitioner

## 2024-11-27 ENCOUNTER — Encounter (INDEPENDENT_AMBULATORY_CARE_PROVIDER_SITE_OTHER): Payer: Self-pay | Admitting: Nurse Practitioner

## 2024-11-27 VITALS — BP 111/72 | HR 94 | Temp 98.7°F | Ht 62.0 in | Wt 179.0 lb

## 2024-11-27 DIAGNOSIS — E66811 Obesity, class 1: Secondary | ICD-10-CM | POA: Diagnosis not present

## 2024-11-27 DIAGNOSIS — E88819 Insulin resistance, unspecified: Secondary | ICD-10-CM

## 2024-11-27 DIAGNOSIS — E7800 Pure hypercholesterolemia, unspecified: Secondary | ICD-10-CM

## 2024-11-27 DIAGNOSIS — Z6832 Body mass index (BMI) 32.0-32.9, adult: Secondary | ICD-10-CM

## 2024-11-27 DIAGNOSIS — E559 Vitamin D deficiency, unspecified: Secondary | ICD-10-CM

## 2024-11-27 MED ORDER — METFORMIN HCL ER 500 MG PO TB24: 500.0000 mg | ORAL_TABLET | Freq: Two times a day (BID) | ORAL | 0 refills | Status: AC

## 2024-11-27 MED ORDER — VITAMIN D (ERGOCALCIFEROL) 1.25 MG (50000 UNIT) PO CAPS: 50000.0000 [IU] | ORAL_CAPSULE | ORAL | 0 refills | Status: AC

## 2024-11-27 NOTE — Progress Notes (Signed)
 " Office: [phone]  /  Fax: [phone]  WEIGHT SUMMARY AND BIOMETRICS  Weight Lost Since Last Visit: 3 lb  Weight Gained Since Last Visit: 0   Vitals Temp: 98.7 F (37.1 C) BP: 111/72 Pulse Rate: 94 SpO2: 98 %   Anthropometric Measurements Height: 5' 2 (1.575 m) Weight: 179 lb (81.2 kg) BMI (Calculated): 32.73 Weight at Last Visit: 182 lb Weight Lost Since Last Visit: 3 lb Weight Gained Since Last Visit: 0 Starting Weight: 184 lb Total Weight Loss (lbs): 5 lb (2.268 kg) Peak Weight: 184 lb Waist Measurement : 39 inches   Body Composition  Body Fat %: 35.6 % Fat Mass (lbs): 63.8 lbs Muscle Mass (lbs): 109.4 lbs Total Body Water (lbs): 72.8 lbs Visceral Fat Rating : 8   Other Clinical Data Fasting: yes Labs: no Today's Visit #: 3 Starting Date: 10/23/24    Total Weight Loss: 5 pounds  Bio Impedance Data reviewed with patient: Muscle is down 1 pound, adipose is down 2.4 pounds  HPI  Chief Complaint: OBESITY  Ayanni is here to discuss her progress with her obesity treatment plan. She is on the the Category 2 Plan and states she is following her eating plan approximately 100 % of the time. She states she is exercising 45 minutes 3 days per week doing cardio and resistance.   Interval History:  Since last office visit she has been exercising with you tube videos 45 minutes 3 days a week.  Breakfast: 2 eggs whites and yogurt with blueberries. Occasional protein smoothie Lunch: Chicken and vegetables or salmon and vegetables.  Dinner: Turkey burgers or chicken and vegetables.  Snacks: has started to decrease snacking She is drinking 48 ounces of water daily- also will drink ICE water  She was started on Metformin  XR 500 mg every day at last visit for Insulin  resistance- HOMA-IR score of 9.47. Denies side effects with the medication.   She was started on Ergocalciferol  50000 units once a week at last visit for Vit D deficiency- denies side effects.     PHYSICAL EXAM:  Blood pressure 111/72, pulse 94, temperature 98.7 F (37.1 C), height 5' 2 (1.575 m), weight 179 lb (81.2 kg), SpO2 98%. Body mass index is 32.74 kg/m.  General: Well Developed, well nourished, and in no acute distress.  HEENT: Normocephalic, atraumatic; EOMI, sclerae are anicteric. Skin: Warm and dry, good turgor Chest:  Normal excursion, shape, no gross ABN Respiratory: No conversational dyspnea; speaking in full sentences NeuroM-Sk:  Normal gross ROM * 4 extremities  Psych: A and O X 3, insight adequate, mood- full    DIAGNOSTIC DATA REVIEWED:  BMET    Component Value Date/Time   NA 140 10/23/2024 0929   K 4.2 10/23/2024 0929   CL 104 10/23/2024 0929   CO2 22 10/23/2024 0929   GLUCOSE 84 10/23/2024 0929   GLUCOSE 100 (H) 01/10/2023 1135   BUN 12 10/23/2024 0929   CREATININE 0.91 10/23/2024 0929   CALCIUM 9.0 10/23/2024 0929   GFRNONAA >60 01/10/2023 1129   GFRAA 101 04/05/2018 1528   Lab Results  Component Value Date   HGBA1C 5.4 10/23/2024   Lab Results  Component Value Date   INSULIN  45.7 (H) 10/23/2024   Lab Results  Component Value Date   TSH 2.820 10/23/2024   CBC    Component Value Date/Time   WBC 7.9 10/23/2024 0929   WBC 8.2 01/10/2023 1129   RBC 4.08 10/23/2024 0929   RBC 3.90 01/10/2023 1129  HGB 12.8 10/23/2024 0929   HCT 39.5 10/23/2024 0929   PLT 414 10/23/2024 0929   MCV 97 10/23/2024 0929   MCH 31.4 10/23/2024 0929   MCH 31.0 01/10/2023 1129   MCHC 32.4 10/23/2024 0929   MCHC 32.1 01/10/2023 1129   RDW 12.1 10/23/2024 0929   Iron Studies No results found for: IRON, TIBC, FERRITIN, IRONPCTSAT Lipid Panel     Component Value Date/Time   CHOL 240 (H) 10/23/2024 0929   TRIG 78 10/23/2024 0929   HDL 52 10/23/2024 0929   CHOLHDL 4.6 (H) 10/23/2024 0929   LDLCALC 175 (H) 10/23/2024 0929   Hepatic Function Panel     Component Value Date/Time   PROT 7.1 10/23/2024 0929   ALBUMIN 4.3 10/23/2024 0929    AST 24 10/23/2024 0929   ALT 28 10/23/2024 0929   ALKPHOS 55 10/23/2024 0929   BILITOT 0.3 10/23/2024 0929      Component Value Date/Time   TSH 2.820 10/23/2024 0929   Nutritional Lab Results  Component Value Date   VD25OH 31.7 10/23/2024     ASSESSMENT AND PLAN Class 1 obesity with serious comorbidity and body mass index (BMI) of 32.0 to 32.9 in adult, unspecified obesity type TREATMENT PLAN FOR OBESITY:  Recommended Dietary Goals  Joanne Summers is currently in the action stage of change. As such, her goal is to continue weight management plan. She has agreed to the Category 2 Plan.  Behavioral Intervention  We discussed the following Behavioral Modification Strategies today: increasing lean protein intake to established goals, increasing vegetables, increasing water intake , continue to work on maintaining a reduced calorie state, getting the recommended amount of protein, incorporating whole foods, making healthy choices, staying well hydrated and practicing mindfulness when eating., and increase protein intake, fibrous foods (25 grams per day for women, 30 grams for men) and water to improve satiety and decrease hunger signals. .  Additional resources provided today: NA  Recommended Physical Activity Goals  Kellyjo has been advised to work up to 150 minutes of moderate intensity aerobic activity a week and strengthening exercises 2-3 times per week for cardiovascular health, weight loss maintenance and preservation of muscle mass.   She has agreed to Increase volume of physical activity to a goal of 150 minutes a week and Combine aerobic and strengthening exercises for efficiency and improved cardiometabolic health.   Pharmacotherapy We discussed various medication options to help Raileigh with her weight loss efforts and we both agreed to increase Metformin  XR 500 mg for insulin  resistance to BID- denies side effects. Continue Ergocalciferol  50000 units once a week for Vit D  deficiency.   ASSOCIATED CONDITIONS ADDRESSED TODAY  Action/Plan  Insulin  resistance Focus on implementing category 2 meal plan, limit saturated fats Increase Metformin  XR to 500 mg BID and monitor for side effects Continue to follow regularly with PCP Focus on getting 150 minutes a week of moderate to high intensity exercise  -     metFORMIN  HCl ER; Take 1 tablet (500 mg total) by mouth 2 (two) times daily with a meal.  Dispense: 60 tablet; Refill: 0  Pure hypercholesterolemia Focus on implementing category 2 meal plan, limit saturated fats. Increase water, lean protein and fiber Loss of 10-15% body weight can improve lipid levels Focus on getting 150 minutes a week of moderate to high intensity exercise   Vitamin D  deficiency Supplement with Ergocalciferol  50000 units once a week  Low vitamin D  levels can be associated with adiposity and may result in  leptin resistance and weight gain. Also associated with fatigue.  Currently on vitamin D  supplementation without any adverse effects such as nausea, vomiting or muscle weakness.   -     Vitamin D  (Ergocalciferol ); Take 1 capsule (50,000 Units total) by mouth every 7 (seven) days.  Dispense: 5 capsule; Refill: 0           Return in about 4 weeks (around 12/25/2024).SABRA She was informed of the importance of frequent follow up visits to maximize her success with intensive lifestyle modifications for her multiple health conditions.   ATTESTASTION STATEMENTS:  Reviewed by clinician on day of visit: allergies, medications, problem list, medical history, surgical history, family history, social history, and previous encounter notes.   Lonell Liverpool ANP-C "

## 2024-12-05 ENCOUNTER — Other Ambulatory Visit: Payer: Self-pay | Admitting: Internal Medicine

## 2024-12-05 DIAGNOSIS — Z1231 Encounter for screening mammogram for malignant neoplasm of breast: Secondary | ICD-10-CM

## 2024-12-25 ENCOUNTER — Ambulatory Visit (INDEPENDENT_AMBULATORY_CARE_PROVIDER_SITE_OTHER): Admitting: Nurse Practitioner
